# Patient Record
Sex: Female | Born: 1955 | Race: White | Hispanic: No | Marital: Married | Smoking: Never smoker
Health system: Southern US, Community
[De-identification: ages and names within clinical notes are randomized; demographics above are authoritative.]

## PROBLEM LIST (undated history)

## (undated) DIAGNOSIS — F32A Depression, unspecified: Secondary | ICD-10-CM

## (undated) DIAGNOSIS — G43909 Migraine, unspecified, not intractable, without status migrainosus: Secondary | ICD-10-CM

## (undated) DIAGNOSIS — K5900 Constipation, unspecified: Secondary | ICD-10-CM

## (undated) DIAGNOSIS — M2550 Pain in unspecified joint: Secondary | ICD-10-CM

## (undated) DIAGNOSIS — N393 Stress incontinence (female) (male): Secondary | ICD-10-CM

## (undated) DIAGNOSIS — Z973 Presence of spectacles and contact lenses: Secondary | ICD-10-CM

## (undated) DIAGNOSIS — M1990 Unspecified osteoarthritis, unspecified site: Secondary | ICD-10-CM

## (undated) DIAGNOSIS — H04123 Dry eye syndrome of bilateral lacrimal glands: Secondary | ICD-10-CM

## (undated) DIAGNOSIS — G4730 Sleep apnea, unspecified: Secondary | ICD-10-CM

## (undated) DIAGNOSIS — R87619 Unspecified abnormal cytological findings in specimens from cervix uteri: Secondary | ICD-10-CM

## (undated) DIAGNOSIS — Z8669 Personal history of other diseases of the nervous system and sense organs: Secondary | ICD-10-CM

## (undated) DIAGNOSIS — A6000 Herpesviral infection of urogenital system, unspecified: Secondary | ICD-10-CM

## (undated) DIAGNOSIS — F329 Major depressive disorder, single episode, unspecified: Secondary | ICD-10-CM

## (undated) DIAGNOSIS — IMO0002 Reserved for concepts with insufficient information to code with codable children: Secondary | ICD-10-CM

## (undated) DIAGNOSIS — B009 Herpesviral infection, unspecified: Secondary | ICD-10-CM

## (undated) HISTORY — PX: GYNECOLOGIC CRYOSURGERY: SHX857

## (undated) HISTORY — DX: Depression, unspecified: F32.A

## (undated) HISTORY — PX: NASAL SEPTUM SURGERY: SHX37

## (undated) HISTORY — DX: Unspecified abnormal cytological findings in specimens from cervix uteri: R87.619

## (undated) HISTORY — DX: Migraine, unspecified, not intractable, without status migrainosus: G43.909

## (undated) HISTORY — DX: Herpesviral infection, unspecified: B00.9

## (undated) HISTORY — PX: OTHER SURGICAL HISTORY: SHX169

## (undated) HISTORY — DX: Reserved for concepts with insufficient information to code with codable children: IMO0002

## (undated) HISTORY — DX: Unspecified osteoarthritis, unspecified site: M19.90

## (undated) HISTORY — DX: Major depressive disorder, single episode, unspecified: F32.9

---

## 1997-07-31 ENCOUNTER — Other Ambulatory Visit: Admission: RE | Admit: 1997-07-31 | Discharge: 1997-07-31 | Payer: Self-pay | Admitting: Gynecology

## 1998-07-02 ENCOUNTER — Other Ambulatory Visit: Admission: RE | Admit: 1998-07-02 | Discharge: 1998-07-02 | Payer: Self-pay | Admitting: Gynecology

## 1998-11-05 ENCOUNTER — Other Ambulatory Visit: Admission: RE | Admit: 1998-11-05 | Discharge: 1998-11-05 | Payer: Self-pay | Admitting: Gynecology

## 1999-05-18 ENCOUNTER — Other Ambulatory Visit: Admission: RE | Admit: 1999-05-18 | Discharge: 1999-05-18 | Payer: Self-pay | Admitting: Gynecology

## 2000-01-08 ENCOUNTER — Other Ambulatory Visit: Admission: RE | Admit: 2000-01-08 | Discharge: 2000-01-08 | Payer: Self-pay | Admitting: Obstetrics and Gynecology

## 2001-02-07 ENCOUNTER — Other Ambulatory Visit: Admission: RE | Admit: 2001-02-07 | Discharge: 2001-02-07 | Payer: Self-pay | Admitting: Obstetrics and Gynecology

## 2001-04-14 ENCOUNTER — Encounter: Admission: RE | Admit: 2001-04-14 | Discharge: 2001-04-14 | Payer: Self-pay | Admitting: Family Medicine

## 2001-04-14 ENCOUNTER — Encounter: Payer: Self-pay | Admitting: Family Medicine

## 2002-02-13 ENCOUNTER — Other Ambulatory Visit: Admission: RE | Admit: 2002-02-13 | Discharge: 2002-02-13 | Payer: Self-pay | Admitting: Obstetrics and Gynecology

## 2003-02-20 ENCOUNTER — Other Ambulatory Visit: Admission: RE | Admit: 2003-02-20 | Discharge: 2003-02-20 | Payer: Self-pay | Admitting: Obstetrics and Gynecology

## 2004-03-16 ENCOUNTER — Other Ambulatory Visit: Admission: RE | Admit: 2004-03-16 | Discharge: 2004-03-16 | Payer: Self-pay | Admitting: Obstetrics and Gynecology

## 2005-03-17 ENCOUNTER — Other Ambulatory Visit: Admission: RE | Admit: 2005-03-17 | Discharge: 2005-03-17 | Payer: Self-pay | Admitting: Obstetrics & Gynecology

## 2006-05-13 ENCOUNTER — Other Ambulatory Visit: Admission: RE | Admit: 2006-05-13 | Discharge: 2006-05-13 | Payer: Self-pay | Admitting: Obstetrics & Gynecology

## 2007-06-26 ENCOUNTER — Other Ambulatory Visit: Admission: RE | Admit: 2007-06-26 | Discharge: 2007-06-26 | Payer: Self-pay | Admitting: Obstetrics & Gynecology

## 2012-09-05 ENCOUNTER — Encounter: Payer: Self-pay | Admitting: Obstetrics & Gynecology

## 2012-09-07 ENCOUNTER — Ambulatory Visit (INDEPENDENT_AMBULATORY_CARE_PROVIDER_SITE_OTHER): Payer: BC Managed Care – PPO | Admitting: Obstetrics & Gynecology

## 2012-09-07 ENCOUNTER — Encounter: Payer: Self-pay | Admitting: Obstetrics & Gynecology

## 2012-09-07 VITALS — BP 116/78 | HR 72 | Ht 60.25 in | Wt 139.0 lb

## 2012-09-07 DIAGNOSIS — Z01419 Encounter for gynecological examination (general) (routine) without abnormal findings: Secondary | ICD-10-CM

## 2012-09-07 DIAGNOSIS — M25551 Pain in right hip: Secondary | ICD-10-CM

## 2012-09-07 DIAGNOSIS — Z0000 Encounter for general adult medical examination without abnormal findings: Secondary | ICD-10-CM

## 2012-09-07 DIAGNOSIS — M25559 Pain in unspecified hip: Secondary | ICD-10-CM

## 2012-09-07 LAB — HEMOGLOBIN, FINGERSTICK: Hemoglobin, fingerstick: 12 g/dL (ref 12.0–16.0)

## 2012-09-07 MED ORDER — ESTRADIOL 0.5 MG PO TABS: 0.5000 mg | ORAL_TABLET | Freq: Every day | ORAL | Status: DC

## 2012-09-07 MED ORDER — MEDROXYPROGESTERONE ACETATE 2.5 MG PO TABS: 2.5000 mg | ORAL_TABLET | Freq: Every day | ORAL | Status: DC

## 2012-09-07 MED ORDER — DICLOFENAC SODIUM 1 % TD GEL: 4.0000 g | Freq: Four times a day (QID) | TRANSDERMAL | Status: DC

## 2012-09-07 MED ORDER — VALACYCLOVIR HCL 1 G PO TABS: 1000.0000 mg | ORAL_TABLET | Freq: Every day | ORAL | Status: DC

## 2012-09-07 NOTE — Progress Notes (Signed)
Patient ID: Joan White, female   DOB: [birth year], 57 y.o.   MRN: [id number]  57 y.o. G2P2 UnknownCaucasianF here for annual exam.  No vaginal bleeding.  Doing well.  Has a new 55 month old grandson.  Patient is going to be keeping him and another child on Mon/Wed/Fri.  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    Sees Dr. Waynard Edwards but hasn't been there for several years.  Reports a new issue with pain that is most noticeable when she sits or stands.  Taking a lot of anti-inflammatories and having some GI issues with this.  Hasn't seen anyone for this.  Aware she needs to see specialist.    No LMP recorded. Patient is postmenopausal.          Sexually active: yes  The current method of family planning is tubal ligation.    Exercising: yes  walking Smoker:  no  Health Maintenance: Pap:  08/05/11, WNL, neg HR HPV History of abnormal Pap:  yes MMG:  07/11/12, Bi-Rad 1 negative Colonoscopy:  04/2005, Dr. Loreta Ave, normal, f/u 10 years BMD:   10/04/07, normal, 0.2/-0.6 TDaP:  05/13/06 Screening Labs: today, Hb today: 12.0, Urine today: Neg, pH 7.0   reports that she has never smoked. She has never used smokeless tobacco. She reports that she does not drink alcohol or use illicit drugs.  Past Medical History  Diagnosis Date  . Migraine   . HSV infection   . Arthritis   . Abnormal Pap smear     h/o cryo  . Depression     Past Surgical History  Procedure Laterality Date  . Btsp    . Cesarean section    . Nasal septum surgery      Current Outpatient Prescriptions  Medication Sig Dispense Refill  . calcium citrate-vitamin D (CALCIUM CITRATE + D) 315-200 MG-UNIT per tablet Take 1 tablet by mouth 2 (two) times daily.      . DULoxetine (CYMBALTA) 60 MG capsule Take 60 mg by mouth daily.      Marland Kitchen estradiol (MINIVELLE) 0.05 MG/24HR Place 1 patch onto the skin 2 (two) times a week.      . Multiple Vitamins-Minerals (MULTIVITAMIN PO) Take by mouth daily.      . valACYclovir (VALTREX)  1000 MG tablet Take 1,000 mg by mouth. Take as directed.      . progesterone (PROMETRIUM) 100 MG capsule Take 100 mg by mouth. Take one tablet days 1-20 monthly       No current facility-administered medications for this visit.    Family History  Problem Relation Age of Onset  . Colon cancer Paternal Grandmother   . Ulcerative colitis Father   . Heart disease Mother   . Hypertension Mother     ROS:  Pertinent items are noted in HPI.  Otherwise, a comprehensive ROS was negative.  Exam:   BP 116/78  Pulse 72  Ht 5' 0.25" (1.53 m)  Wt 139 lb (63.05 kg)  BMI 26.93 kg/m2  Weight change:  +10lbs  Height: 5' 0.25" (153 cm)  Ht Readings from Last 3 Encounters:  09/07/12 5' 0.25" (1.53 m)    General appearance: alert, cooperative and appears stated age Head: Normocephalic, without obvious abnormality, atraumatic Neck: no adenopathy, supple, symmetrical, trachea midline and thyroid normal to inspection and palpation Lungs: clear to auscultation bilaterally Breasts: normal appearance, no masses or tenderness Heart: regular rate and rhythm Abdomen: soft, non-tender; bowel sounds normal; no masses,  no organomegaly Extremities: extremities normal, atraumatic, no cyanosis or edema Skin: Skin color, texture, turgor normal. No rashes or lesions Lymph nodes: Cervical, supraclavicular, and axillary nodes normal. No abnormal inguinal nodes palpated Neurologic: Grossly normal   Pelvic: External genitalia:  no lesions              Urethra:  normal appearing urethra with no masses, tenderness or lesions              Bartholins and Skenes: normal                 Vagina: normal appearing vagina with normal color and discharge, no lesions              Cervix: no lesions              Pap taken: no Bimanual Exam:  Uterus:  normal size, contour, position, consistency, mobility, non-tender              Adnexa: normal adnexa and no mass, fullness, tenderness               Rectovaginal: Confirms                Anus:  normal sphincter tone, no lesions  A:  Well Woman with normal exam Arthritis PMP, on HRT Atypical presentation of HSV  P:   Mammogram yearly. pap smear with neg HR HPV 6/13.  No Pap today. Switch to oral estradiol and provera (0.5mg  and 2.5mg  daily).  Patient may cut estradiol in half. RF for Cymbalta given RF for Valtrex given Trial of Voltaren gel given.   Patient will call Dr. Bedelia Person for appointment return annually or prn  An After Visit Summary was printed and given to the patient.

## 2012-09-07 NOTE — Patient Instructions (Addendum)

## 2012-09-08 ENCOUNTER — Telehealth: Payer: Self-pay | Admitting: Obstetrics & Gynecology

## 2012-09-08 NOTE — Telephone Encounter (Signed)
Patient calling to see which medicine to cut in half: estradial 0.5 mg. and medroxy-progesterone 2.5 mg.?

## 2012-09-11 NOTE — Telephone Encounter (Signed)
Left message on work CB# of need to return call concerning question of medication.

## 2012-09-11 NOTE — Telephone Encounter (Signed)
LMTCB  aa   (Need to tell her she can cut the estradiol in half per last visit note with SM.)

## 2012-09-11 NOTE — Telephone Encounter (Signed)
Patient notified of instructions by Dr. Hyacinth Meeker of need to cut the estradiol in half as noted in Dr. Hyacinth Meeker note at last visit.  Patient understands directions now.

## 2012-09-26 ENCOUNTER — Telehealth: Payer: Self-pay | Admitting: *Deleted

## 2012-09-26 NOTE — Telephone Encounter (Signed)
Spoke with patient concerning Prior Authorization for Rx of Voltaren Gel. States she saw her rheumatologist yesterday and he reccommended this also.. [place]. [place] and sent to Dr. Hyacinth Meeker to fill out. Patient stated she did want to try this. She can not take sulfa medication, Celebrex or Meloxicam.

## 2012-09-29 ENCOUNTER — Telehealth: Payer: Self-pay | Admitting: *Deleted

## 2012-09-29 NOTE — Telephone Encounter (Signed)
Prior authorization sent to fax # [id number].

## 2012-09-29 NOTE — Telephone Encounter (Signed)
Prior auth signed.

## 2012-10-02 NOTE — Telephone Encounter (Signed)
Prior Joan White was faxed on 09-29-12.

## 2012-10-02 NOTE — Telephone Encounter (Signed)
Patient notified prior Joan White was sent on 09-29-12.

## 2013-01-30 ENCOUNTER — Other Ambulatory Visit: Payer: Self-pay | Admitting: Obstetrics & Gynecology

## 2013-01-30 NOTE — Telephone Encounter (Signed)
Generic cymbalta , [facility] [phone]

## 2013-01-30 NOTE — Telephone Encounter (Signed)
AEX: 09/07/12 no rx was sent/given Last refilled: 04/06/12 #30/0 refills (paper chart)  Please advise  (Chart In Your Door)

## 2013-01-30 NOTE — Telephone Encounter (Signed)
Can you please call pt and see if she is taking this as RX has not been filled per pharmacy in such a long time?  Thanks.

## 2013-01-31 MED ORDER — DULOXETINE HCL 60 MG PO CPEP: 60.0000 mg | ORAL_CAPSULE | Freq: Every day | ORAL | Status: DC

## 2013-01-31 NOTE — Telephone Encounter (Signed)
S/W patient she is still taking Cymbalta she thought that Dr. Hyacinth Meeker had gave her a rx and that she gave it to her pharmacist. She had a 90 day prescription that has run out. Needs refill, told her that I would send it to Dr.Miller to approve patient is aware.

## 2013-01-31 NOTE — Telephone Encounter (Signed)
Rx done to [facility]

## 2013-02-06 NOTE — Telephone Encounter (Signed)
Patient notified picked rx yesterday.

## 2013-10-22 ENCOUNTER — Other Ambulatory Visit: Payer: Self-pay

## 2013-10-22 NOTE — Telephone Encounter (Addendum)
Last AEX: 09/07/12 Last refill: Valtrex-09/07/12 #90 X 4, Cymbalta  01/30/13 #90 X 2 Current AEX:11/16/13  Please advise

## 2013-10-24 MED ORDER — DULOXETINE HCL 60 MG PO CPEP
60.0000 mg | ORAL_CAPSULE | Freq: Every day | ORAL | Status: DC
Start: 2013-10-24 — End: 2013-11-16

## 2013-10-24 MED ORDER — VALACYCLOVIR HCL 1 G PO TABS: 1000.0000 mg | ORAL_TABLET | Freq: Every day | ORAL | Status: DC

## 2013-11-16 ENCOUNTER — Encounter: Payer: Self-pay | Admitting: Obstetrics & Gynecology

## 2013-11-16 ENCOUNTER — Ambulatory Visit (INDEPENDENT_AMBULATORY_CARE_PROVIDER_SITE_OTHER): Payer: BC Managed Care – PPO | Admitting: Obstetrics & Gynecology

## 2013-11-16 VITALS — BP 116/66 | HR 88 | Resp 16 | Ht 60.0 in | Wt 130.0 lb

## 2013-11-16 DIAGNOSIS — Z01419 Encounter for gynecological examination (general) (routine) without abnormal findings: Secondary | ICD-10-CM

## 2013-11-16 DIAGNOSIS — Z0000 Encounter for general adult medical examination without abnormal findings: Secondary | ICD-10-CM

## 2013-11-16 DIAGNOSIS — Z124 Encounter for screening for malignant neoplasm of cervix: Secondary | ICD-10-CM

## 2013-11-16 LAB — POCT URINALYSIS DIPSTICK
Bilirubin, UA: NEGATIVE
Blood, UA: NEGATIVE
Glucose, UA: NEGATIVE
Ketones, UA: NEGATIVE
NITRITE UA: NEGATIVE
PH UA: 5
Protein, UA: NEGATIVE
UROBILINOGEN UA: NEGATIVE

## 2013-11-16 LAB — HEMOGLOBIN, FINGERSTICK: HEMOGLOBIN, FINGERSTICK: 11.8 g/dL — AB (ref 12.0–16.0)

## 2013-11-16 MED ORDER — VALACYCLOVIR HCL 1 G PO TABS: 1000.0000 mg | ORAL_TABLET | Freq: Every day | ORAL | Status: DC

## 2013-11-16 MED ORDER — DULOXETINE HCL 60 MG PO CPEP: 60.0000 mg | ORAL_CAPSULE | Freq: Every day | ORAL | Status: DC

## 2013-11-16 MED ORDER — ALPRAZOLAM 0.25 MG PO TABS: 0.2500 mg | ORAL_TABLET | Freq: Every evening | ORAL | Status: DC | PRN

## 2013-11-16 MED ORDER — ESTRADIOL 0.5 MG PO TABS: 0.5000 mg | ORAL_TABLET | Freq: Every day | ORAL | Status: DC

## 2013-11-16 MED ORDER — MEDROXYPROGESTERONE ACETATE 2.5 MG PO TABS: 2.5000 mg | ORAL_TABLET | Freq: Every day | ORAL | Status: DC

## 2013-11-16 NOTE — Progress Notes (Addendum)
58 y.o. G2P2 UnknownCaucasianF here for annual exam.  No vaginal bleeding.  Doing well.  Helping to take care of grandchildren.  Keeping busy.    Having some left axillary pain on left.  Has been going on 5-6 months.  Is carrying grandchild on the left side a lot.   PCP:  Dr. Waynard EdwardsPerini.  Hasn't seen in several years.   Husband is an alcoholic by hx and has started drinking again.  This is causing some increase stressors.  Would like a small prescription for Xanax.  No LMP recorded. Patient is postmenopausal.          Sexually active: Yes.    The current method of family planning is post menopausal status.    Exercising: No.  The patient does not participate in regular exercise at present. Smoker:  No   Health Maintenance: Pap: 07/2011 Neg. HR HPV: neg History of abnormal Pap:  yes MMG: 11/13/13 normal per pt, [facility] Colonoscopy: 04/2005 Normal. Repeat in 10 years  BMD:  09/2007 Normal TDaP:  2008 Screening Labs: today, Hb today: today, Urine today: WBC=small   reports that she has never smoked. She has never used smokeless tobacco. She reports that she does not drink alcohol or use illicit drugs.  Past Medical History  Diagnosis Date  . Migraine   . HSV infection   . Arthritis   . Abnormal Pap smear     h/o cryo  . Depression     Past Surgical History  Procedure Laterality Date  . Btsp    . Cesarean section    . Nasal septum surgery      Current Outpatient Prescriptions  Medication Sig Dispense Refill  . diclofenac sodium (VOLTAREN) 1 % GEL Apply 4 g topically 4 (four) times daily.  1 Tube  2  . DULoxetine (CYMBALTA) 60 MG capsule Take 1 capsule (60 mg total) by mouth daily.  90 capsule  0  . estradiol (ESTRACE) 0.5 MG tablet Take 1 tablet (0.5 mg total) by mouth daily.  90 tablet  4  . MAGNESIUM PO Take 750 mg by mouth daily.      . medroxyPROGESTERone (PROVERA) 2.5 MG tablet Take 1 tablet (2.5 mg total) by mouth daily.  90 tablet  4  . Melatonin 5 MG TABS Take by mouth  daily.      . Multiple Vitamins-Minerals (MULTIVITAMIN PO) Take by mouth daily.      . valACYclovir (VALTREX) 1000 MG tablet Take 1 tablet (1,000 mg total) by mouth daily. Take as directed.  90 tablet  0   No current facility-administered medications for this visit.    Family History  Problem Relation Age of Onset  . Colon cancer Paternal Grandmother   . Ulcerative colitis Father   . Heart disease Mother   . Hypertension Mother     ROS:  Pertinent items are noted in HPI.  Otherwise, a comprehensive ROS was negative.  Exam:   BP 116/66  Pulse 88  Resp 16  Ht 5' (1.524 m)  Wt 130 lb (58.968 kg)  BMI 25.39 kg/m2  Weight change: -9#  Height: 5' (152.4 cm)  Ht Readings from Last 3 Encounters:  11/16/13 5' (1.524 m)  09/07/12 5' 0.25" (1.53 m)    General appearance: alert, cooperative and appears stated age Head: Normocephalic, without obvious abnormality, atraumatic Neck: no adenopathy, supple, symmetrical, trachea midline and thyroid normal to inspection and palpation Lungs: clear to auscultation bilaterally Breasts: normal appearance, no masses or tenderness Heart:  regular rate and rhythm Abdomen: soft, non-tender; bowel sounds normal; no masses,  no organomegaly Extremities: extremities normal, atraumatic, no cyanosis or edema Skin: Skin color, texture, turgor normal. No rashes or lesions Lymph nodes: Cervical, supraclavicular, and axillary nodes normal. No abnormal inguinal nodes palpated Neurologic: Grossly normal   Pelvic: External genitalia:  no lesions              Urethra:  normal appearing urethra with no masses, tenderness or lesions              Bartholins and Skenes: normal                 Vagina: normal appearing vagina with normal color and discharge, no lesions              Cervix: no lesions              Pap taken: Yes.   Bimanual Exam:  Uterus:  normal size, contour, position, consistency, mobility, non-tender              Adnexa: normal adnexa and no  mass, fullness, tenderness               Rectovaginal: Confirms               Anus:  normal sphincter tone, no lesions  A:  Well Woman with normal exam  Arthritis  PMP, on HRT  Atypical presentation of HSV Axilla pain--no findings on ultrasound exam Anxiety, situational   P: Mammogram yearly.  pap smear with neg HR HPV 6/13.  Pap today. RX for Estradiol 0.5mg  daily (pt cutting in half) and Provera 2.5mg  daily RF for Cymbalta 60mg  daily given  RF for Valtrex 1gm given  Xanax 0.25mg  prn #30/0RF CMP, TSH, Lipids, Vit D return annually or prn  An After Visit Summary was printed and given to the patient.

## 2013-11-17 LAB — LIPID PANEL
Cholesterol: 132 mg/dL (ref 0–200)
HDL: 69 mg/dL (ref >39)
LDL CALC: 41 mg/dL (ref 0–99)
Total CHOL/HDL Ratio: 1.9 Ratio
Triglycerides: 109 mg/dL (ref <150)
VLDL: 22 mg/dL (ref 0–40)

## 2013-11-17 LAB — COMPREHENSIVE METABOLIC PANEL
ALT: 16 U/L (ref 0–35)
AST: 21 U/L (ref 0–37)
Albumin: 4.5 g/dL (ref 3.5–5.2)
Alkaline Phosphatase: 75 U/L (ref 39–117)
BUN: 14 mg/dL (ref 6–23)
CALCIUM: 9.8 mg/dL (ref 8.4–10.5)
CO2: 28 mEq/L (ref 19–32)
Chloride: 97 mEq/L (ref 96–112)
Creat: 0.7 mg/dL (ref 0.50–1.10)
Glucose, Bld: 86 mg/dL (ref 70–99)
Potassium: 3.8 mEq/L (ref 3.5–5.3)
SODIUM: 135 meq/L (ref 135–145)
TOTAL PROTEIN: 7.4 g/dL (ref 6.0–8.3)
Total Bilirubin: 0.3 mg/dL (ref 0.2–1.2)

## 2013-11-17 LAB — TSH: TSH: 1.379 u[IU]/mL (ref 0.350–4.500)

## 2013-11-17 LAB — VITAMIN D 25 HYDROXY (VIT D DEFICIENCY, FRACTURES): Vit D, 25-Hydroxy: 46 ng/mL (ref 30–89)

## 2013-11-19 ENCOUNTER — Telehealth: Payer: Self-pay

## 2013-11-19 LAB — IPS PAP TEST WITH REFLEX TO HPV

## 2013-11-19 NOTE — Telephone Encounter (Signed)
Message copied by Elisha HeadlandNIX, Hayla Hinger S on Mon Nov 19, 2013 11:35 AM ------      Message from: Jerene BearsMILLER, MARY S      Created: Mon Nov 19, 2013  6:15 AM       Inform lipids, CMP, TSH, and Vit D were all normal. ------

## 2013-11-19 NOTE — Telephone Encounter (Signed)
Lmtcb//kn 

## 2013-11-19 NOTE — Telephone Encounter (Signed)
Pt called kelly back during lunch °

## 2013-11-20 NOTE — Telephone Encounter (Signed)
Patient notified of all results.//kn 

## 2013-11-20 NOTE — Telephone Encounter (Signed)
Pt returning call

## 2013-12-10 ENCOUNTER — Encounter: Payer: Self-pay | Admitting: Obstetrics & Gynecology

## 2014-08-01 ENCOUNTER — Other Ambulatory Visit: Payer: Self-pay | Admitting: *Deleted

## 2014-08-01 MED ORDER — TRAMADOL HCL 50 MG PO TABS: 50.0000 mg | ORAL_TABLET | Freq: Four times a day (QID) | ORAL | Status: DC | PRN

## 2014-08-01 NOTE — Telephone Encounter (Addendum)
Medication refill request: Tramadol 50 mg Last AEX:  11/16/13 with SM Next AEX: 12/27/14 with SM Last MMG (if hormonal medication request): n/a Refill authorized: Please advise.  Patient called and said that she was pulling some things and pulled her shoulder really bad. She wanted Dr. Hyacinth Meeker to know that she is going to see Dr.Perini in August as a new patient appointment and was wondering if Dr. Hyacinth Meeker could refill this for her, please advise.

## 2014-08-02 ENCOUNTER — Telehealth: Payer: Self-pay | Admitting: Obstetrics & Gynecology

## 2014-08-02 NOTE — Telephone Encounter (Signed)
Patient is asking for status of refill. Patient says she spoke with a nurse yesterday. Last seen 11/16/13.

## 2014-08-02 NOTE — Telephone Encounter (Signed)
Patient notified that rx is being faxed in by reina,cma

## 2014-08-02 NOTE — Telephone Encounter (Signed)
Pt aware rx has been faxed by reina,cma

## 2014-12-27 ENCOUNTER — Encounter: Payer: Self-pay | Admitting: Obstetrics & Gynecology

## 2014-12-27 ENCOUNTER — Ambulatory Visit (INDEPENDENT_AMBULATORY_CARE_PROVIDER_SITE_OTHER): Payer: BC Managed Care – PPO | Admitting: Obstetrics & Gynecology

## 2014-12-27 VITALS — BP 118/82 | HR 80 | Resp 16 | Ht 60.25 in | Wt 138.0 lb

## 2014-12-27 DIAGNOSIS — Z01419 Encounter for gynecological examination (general) (routine) without abnormal findings: Secondary | ICD-10-CM

## 2014-12-27 DIAGNOSIS — Z79890 Hormone replacement therapy (postmenopausal): Secondary | ICD-10-CM | POA: Diagnosis not present

## 2014-12-27 MED ORDER — DULOXETINE HCL 60 MG PO CPEP: 60.0000 mg | ORAL_CAPSULE | Freq: Every day | ORAL | Status: DC

## 2014-12-27 MED ORDER — CYCLOBENZAPRINE HCL 10 MG PO TABS
10.0000 mg | ORAL_TABLET | Freq: Three times a day (TID) | ORAL | Status: DC | PRN
Start: 2014-12-27 — End: 2015-06-06

## 2014-12-27 MED ORDER — VALACYCLOVIR HCL 1 G PO TABS: 1000.0000 mg | ORAL_TABLET | Freq: Every day | ORAL | Status: DC

## 2014-12-27 MED ORDER — ESTRADIOL 0.5 MG PO TABS: 0.5000 mg | ORAL_TABLET | Freq: Every day | ORAL | Status: DC

## 2014-12-27 MED ORDER — MEDROXYPROGESTERONE ACETATE 2.5 MG PO TABS: 2.5000 mg | ORAL_TABLET | Freq: Every day | ORAL | Status: DC

## 2014-12-27 NOTE — Progress Notes (Signed)
59 y.o. G2P2 Married CaucasianF here for annual exam.  Doing well.  No vaginal bleeding.  Pt reports she's having a lot of neck and back tightness.  She does have some stressors.  Has been to a chiropractor for this as well.  Using heat on her back.  Did do a massage today and she's really felt like this happened.  X ray ordered an x ray and this wasn't diagnostic.  MRI has been recommended.  Pt doesn't want to do this currently due to cost.  PCP:  Dr. Waynard Edwards.  Planning blood work next year.    No LMP recorded. Patient is postmenopausal.          Sexually active: Yes.    The current method of family planning is tubal ligation.    Exercising: No.  not regularly Smoker:  no  Health Maintenance: Pap:  11/16/13 WNL, 6/13 WNL/negative HR HPV History of abnormal Pap:  Yes h/o cryosurgery MMG:  11/13/13 MMG, 11/15/13 right diag-BiRads 2 Benign Colonoscopy:  3/07-repeat in 10 years.  Pt aware this is due next year.   BMD:   8/09-normal TDaP:  1/16 Screening Labs: Dr Waynard Edwards, Hb today: Dr Waynard Edwards, Urine today: Dr Waynard Edwards   reports that she has never smoked. She has never used smokeless tobacco. She reports that she does not drink alcohol or use illicit drugs.  Past Medical History  Diagnosis Date  . Migraine   . HSV infection   . Arthritis   . Abnormal Pap smear     h/o cryo  . Depression     Past Surgical History  Procedure Laterality Date  . Btsp    . Cesarean section    . Nasal septum surgery      Current Outpatient Prescriptions  Medication Sig Dispense Refill  . ALPRAZolam (XANAX) 0.25 MG tablet Take 1 tablet (0.25 mg total) by mouth at bedtime as needed for anxiety. 30 tablet 0  . Cholecalciferol (VITAMIN D PO) Take 1,000 Int'l Units by mouth.    . Cyanocobalamin (VITAMIN B 12 PO) Take 1,000 mg by mouth.    . diclofenac sodium (VOLTAREN) 1 % GEL Apply 4 g topically 4 (four) times daily. 1 Tube 2  . DULoxetine (CYMBALTA) 60 MG capsule Take 1 capsule (60 mg total) by mouth daily. 90  capsule 4  . estradiol (ESTRACE) 0.5 MG tablet Take 1 tablet (0.5 mg total) by mouth daily. 90 tablet 4  . MAGNESIUM PO Take 750 mg by mouth daily.    . medroxyPROGESTERone (PROVERA) 2.5 MG tablet Take 1 tablet (2.5 mg total) by mouth daily. 90 tablet 4  . Melatonin 5 MG TABS Take by mouth daily.    . methocarbamol (ROBAXIN) 500 MG tablet     . Multiple Vitamins-Minerals (MULTIVITAMIN PO) Take by mouth daily.    . valACYclovir (VALTREX) 1000 MG tablet Take 1 tablet (1,000 mg total) by mouth daily. Take as directed. 90 tablet 4   No current facility-administered medications for this visit.    Family History  Problem Relation Age of Onset  . Colon cancer Paternal Grandmother   . Ulcerative colitis Father   . Heart disease Mother   . Hypertension Mother     ROS:  Pertinent items are noted in HPI.  Otherwise, a comprehensive ROS was negative.  Exam:   BP 118/82 mmHg  Pulse 80  Resp 16  Ht 5' 0.25" (1.53 m)  Wt 138 lb (62.596 kg)  BMI 26.74 kg/m2    Height:  5' 0.25" (153 cm)  Ht Readings from Last 3 Encounters:  12/27/14 5' 0.25" (1.53 m)  11/16/13 5' (1.524 m)  09/07/12 5' 0.25" (1.53 m)    General appearance: alert, cooperative and appears stated age Head: Normocephalic, without obvious abnormality, atraumatic Neck: no adenopathy, supple, symmetrical, trachea midline and thyroid normal to inspection and palpation Lungs: clear to auscultation bilaterally Breasts: normal appearance, no masses or tenderness Heart: regular rate and rhythm Abdomen: soft, non-tender; bowel sounds normal; no masses,  no organomegaly Extremities: extremities normal, atraumatic, no cyanosis or edema Skin: Skin color, texture, turgor normal. No rashes or lesions Lymph nodes: Cervical, supraclavicular, and axillary nodes normal. No abnormal inguinal nodes palpated Neurologic: Grossly normal  Pelvic: External genitalia:  no lesions              Urethra:  normal appearing urethra with no masses,  tenderness or lesions              Bartholins and Skenes: normal                 Vagina: normal appearing vagina with normal color and discharge, no lesions              Cervix: no lesions              Pap taken: No. Bimanual Exam:  Uterus:  normal size, contour, position, consistency, mobility, non-tender              Adnexa: normal adnexa and no mass, fullness, tenderness               Rectovaginal: Confirms               Anus:  normal sphincter tone, no lesions  Chaperone was present for exam.  A:  Well Woman with normal exam  Arthritis  PMP, on HRT  Atypical presentation of HSV Muscle spasm Anxiety, situational  P: Mammogram yearly. Pt will check about insurance coverage for BMD and call us if she wants us to schedule this for her. pap smear with neg HR HPV 6/13. Pap 2015.  No pap today. RX for Estradiol 0.5mg  daily (pt cutting in half) and Provera 2.5mg  daily RF for Cymbalta 60mg  daily.  #90/4RF RF for Valtrex 1gm.  #90/4RF Flexeril 10mg  tid prn given to see if this works better than the Robaxin that we have on file. Labs next year with Dr. Waynard EdwardsPerini.   return annually or prn

## 2014-12-27 NOTE — Patient Instructions (Addendum)
[facility] [address] [place], [place] [address]  [phone]   Joan RiddleNing White-- acupuncture

## 2015-01-09 ENCOUNTER — Telehealth: Payer: Self-pay | Admitting: Obstetrics & Gynecology

## 2015-01-09 NOTE — Telephone Encounter (Signed)
Patient called and requested an order be faxed to [facility] for [facility] density. She is having her mammogram 01/14/15 and wants to have the bone density on the same day.

## 2015-01-09 NOTE — Telephone Encounter (Signed)
Order signed.  Will return to you to fax.

## 2015-01-09 NOTE — Telephone Encounter (Signed)
BMD order to Dr.Miller for review and signature prior to faxing.

## 2015-01-10 NOTE — Telephone Encounter (Signed)
Order for BMD faxed to [facility] with cover sheet and confirmation. Patient notified.  Will close encounter.

## 2015-01-17 ENCOUNTER — Telehealth: Payer: Self-pay | Admitting: *Deleted

## 2015-01-17 NOTE — Telephone Encounter (Signed)
"   BMD showed mild osteopenia in R Femur. Ok to continue to watch. Recheck 3 - 5 years" - Dr Hyacinth MeekerMiller.  Called patient. Notified. Verbalized understanding.   BMD to scan.  Encounter closed.

## 2015-01-20 ENCOUNTER — Other Ambulatory Visit: Payer: Self-pay | Admitting: Internal Medicine

## 2015-01-20 DIAGNOSIS — M542 Cervicalgia: Secondary | ICD-10-CM

## 2015-01-23 ENCOUNTER — Other Ambulatory Visit: Payer: Self-pay

## 2015-06-06 ENCOUNTER — Other Ambulatory Visit: Payer: Self-pay | Admitting: Obstetrics & Gynecology

## 2015-06-06 NOTE — Telephone Encounter (Signed)
Medication refill request: Flexeril 10 mg  Last AEX:  12/27/2014 with SM  Next AEX: 04/23/2016 with SM  Last MMG (if hormonal medication request): n/a Refill authorized: Please advise.  Routed to Dr. Edward JollySilva since Dr. Hyacinth MeekerMiller is out of office today.

## 2015-11-10 ENCOUNTER — Telehealth: Payer: Self-pay | Admitting: Obstetrics & Gynecology

## 2015-11-10 NOTE — Telephone Encounter (Signed)
Patient wanting nurse to check her chart to find out when she had her last colonoscopy.

## 2015-11-10 NOTE — Telephone Encounter (Signed)
Spoke with patient. Patient calling to see if we have record of last colonoscopy. Advised patient per AEX dated 09/07/12; colonoscopy with Dr. Loreta AveMann 04/2005. Provided contact information for Dr. Loreta AveMann; [phone]. Patient to call and schedule 10 year colonoscopy. Patient states she does not remember seeing Dr. Hyacinth MeekerMiller for AEX this year, patient last seen for AEX 12/27/14. Patient was on schedule for 04/23/16 for AEX; per patient request, rescheduled to 12/30/15 at 1:30 pm for AEX with Dr. Hyacinth MeekerMiller. Patient is agreeable to date and time.   Routing to provider for final review. Patient is agreeable to disposition. Will close encounter.

## 2015-11-10 NOTE — Telephone Encounter (Signed)
I have attempted to contact this patient by phone with the following results: left message to return my call on answering machine (mobile). [phone] (Mobile)

## 2015-11-18 DIAGNOSIS — M25562 Pain in left knee: Secondary | ICD-10-CM

## 2015-11-18 DIAGNOSIS — G8929 Other chronic pain: Secondary | ICD-10-CM | POA: Insufficient documentation

## 2015-11-18 DIAGNOSIS — M25561 Pain in right knee: Secondary | ICD-10-CM

## 2015-12-30 ENCOUNTER — Ambulatory Visit (INDEPENDENT_AMBULATORY_CARE_PROVIDER_SITE_OTHER): Payer: BC Managed Care – PPO | Admitting: Obstetrics & Gynecology

## 2015-12-30 ENCOUNTER — Encounter: Payer: Self-pay | Admitting: Obstetrics & Gynecology

## 2015-12-30 VITALS — BP 120/70 | HR 90 | Resp 14 | Ht 61.0 in | Wt 139.2 lb

## 2015-12-30 DIAGNOSIS — Z124 Encounter for screening for malignant neoplasm of cervix: Secondary | ICD-10-CM | POA: Diagnosis not present

## 2015-12-30 DIAGNOSIS — Z205 Contact with and (suspected) exposure to viral hepatitis: Secondary | ICD-10-CM | POA: Diagnosis not present

## 2015-12-30 DIAGNOSIS — Z01419 Encounter for gynecological examination (general) (routine) without abnormal findings: Secondary | ICD-10-CM | POA: Diagnosis not present

## 2015-12-30 MED ORDER — ESTRADIOL 0.5 MG PO TABS: 0.5000 mg | ORAL_TABLET | Freq: Every day | ORAL | 4 refills | Status: DC

## 2015-12-30 MED ORDER — MEDROXYPROGESTERONE ACETATE 2.5 MG PO TABS: 2.5000 mg | ORAL_TABLET | Freq: Every day | ORAL | 4 refills | Status: DC

## 2015-12-30 NOTE — Progress Notes (Signed)
60 y.o. G2P2 Unknown Caucasian F here for annual exam.  Pt's reports lots of events in her family.  She had a new granddaughter born in July.  [place] in [place].  She has separate, again, as husband is an alcoholic and drinking again.  She is in her parent's home as they are both in assisted living.  She is keeping her two grandchildren two or three days a week.    Denies vaginal bleeding.   Reports she's having left shoulder pain.  Dr. Waynard EdwardsPerini wanted her to have an MRI.  Co-pay was $700.  She doesn't feel like she can do this, financially at this time.   PCP:  Dr. Waynard EdwardsPerini.    No LMP recorded. Patient is postmenopausal.          Sexually active: No.  The current method of family planning is post menopausal status.    Exercising: No.  The patient does not participate in regular exercise at present. Smoker:  no  Health Maintenance: Pap:  11/16/13 negative  History of abnormal Pap:  yes MMG:  01/14/15 BIRADS 2 benign  Colonoscopy:  12/24/15 with Dr. Loreta AveMann, follow up 10 years.  BMD:   01/14/15 osteopenia in right femur  TDaP:  1/16  Pneumonia vaccine(s):  11/18/15  Zostavax:   04/10/14  Hep C testing: will do today Screening Labs: PCP, Hb today: PCP, Urine today: PCP   reports that she has never smoked. She has never used smokeless tobacco. She reports that she does not drink alcohol or use drugs.  Past Medical History:  Diagnosis Date  . Abnormal Pap smear    h/o cryo  . Arthritis   . Depression   . HSV infection   . Migraine     Past Surgical History:  Procedure Laterality Date  . BTSP    . CESAREAN SECTION    . GYNECOLOGIC CRYOSURGERY    . NASAL SEPTUM SURGERY      Current Outpatient Prescriptions  Medication Sig Dispense Refill  . ALPRAZolam (XANAX) 0.25 MG tablet Take 1 tablet (0.25 mg total) by mouth at bedtime as needed for anxiety. 30 tablet 0  . Cholecalciferol (VITAMIN D PO) Take 1,000 Int'l Units by mouth.    . Cyanocobalamin (VITAMIN B 12 PO) Take 1,000 mg  by mouth.    . cyclobenzaprine (FLEXERIL) 10 MG tablet TAKE 1 TABLET BY MOUTH 3 TIMES DAILY AS NEEDED FOR MUSCLE SPASMS. 30 tablet 0  . diclofenac sodium (VOLTAREN) 1 % GEL Apply 4 g topically 4 (four) times daily. 1 Tube 2  . DULoxetine (CYMBALTA) 60 MG capsule Take 1 capsule (60 mg total) by mouth daily. 90 capsule 4  . estradiol (ESTRACE) 0.5 MG tablet Take 1 tablet (0.5 mg total) by mouth daily. 90 tablet 4  . MAGNESIUM PO Take 750 mg by mouth daily.    . medroxyPROGESTERone (PROVERA) 2.5 MG tablet Take 1 tablet (2.5 mg total) by mouth daily. 90 tablet 4  . Melatonin 5 MG TABS Take by mouth daily.    . methocarbamol (ROBAXIN) 500 MG tablet     . Multiple Vitamins-Minerals (MULTIVITAMIN PO) Take by mouth daily.    . valACYclovir (VALTREX) 1000 MG tablet Take 1 tablet (1,000 mg total) by mouth daily. Take as directed. 90 tablet 4   No current facility-administered medications for this visit.     Family History  Problem Relation Age of Onset  . Colon cancer Paternal Grandmother   . Ulcerative colitis Father   .  Heart disease Mother   . Hypertension Mother     ROS:  Pertinent items are noted in HPI.  Otherwise, a comprehensive ROS was negative.  Exam:   BP 120/70 (BP Location: Right Arm, Patient Position: Sitting, Cuff Size: Normal)   Pulse 90   Resp 14   Ht 5\' 1"  (1.549 m)   Wt 139 lb 3.2 oz (63.1 kg)   BMI 26.30 kg/m   Weight change: +1#  Height: 5\' 1"  (154.9 cm)  Ht Readings from Last 3 Encounters:  12/30/15 5\' 1"  (1.549 m)  12/27/14 5' 0.25" (1.53 m)  11/16/13 5' (1.524 m)    General appearance: alert, cooperative and appears stated age Head: Normocephalic, without obvious abnormality, atraumatic Neck: no adenopathy, supple, symmetrical, trachea midline and thyroid normal to inspection and palpation Lungs: clear to auscultation bilaterally Breasts: normal appearance, no masses or tenderness Heart: regular rate and rhythm Abdomen: soft, non-tender; bowel sounds normal;  no masses,  no organomegaly Extremities: extremities normal, atraumatic, no cyanosis or edema Skin: Skin color, texture, turgor normal. No rashes or lesions Lymph nodes: Cervical, supraclavicular, and axillary nodes normal. No abnormal inguinal nodes palpated Neurologic: Grossly normal   Pelvic: External genitalia:  no lesions              Urethra:  normal appearing urethra with no masses, tenderness or lesions              Bartholins and Skenes: normal                 Vagina: normal appearing vagina with normal color and discharge, no lesions              Cervix: no lesions              Pap taken: Yes.   Bimanual Exam:  Uterus:  normal size, contour, position, consistency, mobility, non-tender              Adnexa: normal adnexa and no mass, fullness, tenderness               Rectovaginal: Confirms               Anus:  normal sphincter tone, no lesions  Chaperone was present for exam.  A:   Well Woman with normal exam  Arthritis in shoulder and knee  PMP, on HRT  H/O atypical HSV Anxiety, situational  P:  Mammogram yearly.  Pap smear and HR HPV obtained today. RX for Estradiol 0.5mg  daily (pt cutting in half) and Provera 2.5mg  daily She will call if needs RFs for Valtrex and/or Cymbalta Labs /vaccines done with Dr. Waynard EdwardsPerini.    Hep C antibody Return annually or prn

## 2015-12-31 ENCOUNTER — Other Ambulatory Visit: Payer: Self-pay | Admitting: Obstetrics & Gynecology

## 2015-12-31 LAB — HEPATITIS C ANTIBODY: HCV AB: NEGATIVE

## 2015-12-31 MED ORDER — DULOXETINE HCL 60 MG PO CPEP: 60.0000 mg | ORAL_CAPSULE | Freq: Every day | ORAL | 4 refills | Status: AC

## 2015-12-31 NOTE — Telephone Encounter (Signed)
Patient calling for a prescription for Duloxetine HCL 60MG  sent to [facility] at [phone].

## 2015-12-31 NOTE — Telephone Encounter (Signed)
Med refill request: Duloxetine 60mg  Last AEX: 12/30/15 Next AEX: 01/10/17 Last MMG (if hormonal med) Refill authorized: Please Advise? Last filled 12/27/14 by MM -#[id number]   Cc: Dr. Hyacinth MeekerMiller

## 2016-01-02 LAB — PAP IG AND HPV HIGH-RISK: HPV DNA High Risk: NOT DETECTED

## 2016-01-05 NOTE — Telephone Encounter (Signed)
Prescription faxed to [facility]. Spoke with patient, advised RX sent. Patient verbalizes understanding and is agreeable.  Routing to provider for final review. Patient is agreeable to disposition. Will close encounter.

## 2016-01-22 ENCOUNTER — Encounter: Payer: Self-pay | Admitting: Obstetrics & Gynecology

## 2016-04-23 ENCOUNTER — Ambulatory Visit: Payer: BC Managed Care – PPO | Admitting: Obstetrics & Gynecology

## 2016-06-01 ENCOUNTER — Other Ambulatory Visit: Payer: Self-pay | Admitting: Orthopedic Surgery

## 2016-06-01 DIAGNOSIS — R52 Pain, unspecified: Secondary | ICD-10-CM

## 2016-06-08 HISTORY — PX: SHOULDER SURGERY: SHX246

## 2016-06-14 ENCOUNTER — Ambulatory Visit
Admission: RE | Admit: 2016-06-14 | Discharge: 2016-06-14 | Disposition: A | Discharge status: Home / Self Care | Payer: BC Managed Care – PPO | Source: Ambulatory Visit | Attending: Orthopedic Surgery | Admitting: Orthopedic Surgery

## 2016-06-14 DIAGNOSIS — R52 Pain, unspecified: Secondary | ICD-10-CM

## 2016-06-28 DIAGNOSIS — Z9889 Other specified postprocedural states: Secondary | ICD-10-CM | POA: Insufficient documentation

## 2016-12-06 ENCOUNTER — Telehealth: Payer: Self-pay | Admitting: Obstetrics & Gynecology

## 2016-12-06 NOTE — Telephone Encounter (Signed)
Patient called and left a message at lunch on our voice mail requesting a call back. She did not leave any other details. I returned her call and left a message to call our office back.

## 2017-01-10 ENCOUNTER — Ambulatory Visit: Payer: BC Managed Care – PPO | Admitting: Obstetrics & Gynecology

## 2017-02-08 HISTORY — PX: CATARACT EXTRACTION, BILATERAL: SHX1313

## 2017-02-14 ENCOUNTER — Other Ambulatory Visit: Payer: Self-pay

## 2017-02-14 ENCOUNTER — Ambulatory Visit (INDEPENDENT_AMBULATORY_CARE_PROVIDER_SITE_OTHER): Payer: BC Managed Care – PPO | Admitting: Obstetrics & Gynecology

## 2017-02-14 ENCOUNTER — Encounter: Payer: Self-pay | Admitting: Obstetrics & Gynecology

## 2017-02-14 VITALS — BP 130/86 | HR 72 | Resp 12 | Ht 60.0 in | Wt 133.4 lb

## 2017-02-14 DIAGNOSIS — Z01419 Encounter for gynecological examination (general) (routine) without abnormal findings: Secondary | ICD-10-CM | POA: Diagnosis not present

## 2017-02-14 DIAGNOSIS — N898 Other specified noninflammatory disorders of vagina: Secondary | ICD-10-CM

## 2017-02-14 MED ORDER — MEDROXYPROGESTERONE ACETATE 2.5 MG PO TABS: 2.5000 mg | ORAL_TABLET | Freq: Every day | ORAL | 4 refills | Status: DC

## 2017-02-14 MED ORDER — TERCONAZOLE 0.4 % VA CREA: 1.0000 | TOPICAL_CREAM | Freq: Every day | VAGINAL | 0 refills | Status: DC

## 2017-02-14 MED ORDER — ESTRADIOL 0.5 MG PO TABS: 0.5000 mg | ORAL_TABLET | Freq: Every day | ORAL | 4 refills | Status: DC

## 2017-02-14 MED ORDER — ESTRADIOL 0.1 MG/GM VA CREA
TOPICAL_CREAM | VAGINAL | 4 refills | Status: DC
Start: 2017-02-14 — End: 2017-04-29

## 2017-02-14 NOTE — Progress Notes (Signed)
62 y.o. G2P2 Divorced Caucasian F here for annual exam.  Doing well.  Got married 01/28/17.  He had back surgery in early December.  She is going to change her last name to Casimiro NeedleMichael.  Not concerned with any STDs.  Denies vaginal bleeding.  Having some dryness.      No LMP recorded. Patient is postmenopausal.          Sexually active: Yes.    The current method of family planning is post menopausal status.    Exercising: Yes.    walking Smoker:  no  Health Maintenance: Pap:  12/30/15 neg. HR HPV:neg   11/16/13 Neg  History of abnormal Pap:  yes MMG:  [facility] Colonoscopy:  12/24/15 Normal. F/u 10 years  BMD:   01/14/15 Osteopenia, done last week with PCP TDaP:  2016 Pneumonia vaccine(s):  2017, 2018 with PCP  Shingrix:   2016 Hep C testing: 12/30/15 Neg  Screening Labs: PCP, Hb today: PCP, Urine today: has urine sample if needed    reports that  has never smoked. she has never used smokeless tobacco. She reports that she does not drink alcohol or use drugs.  Past Medical History:  Diagnosis Date  . Abnormal Pap smear    h/o cryo  . Arthritis   . Depression   . HSV infection   . Migraine     Past Surgical History:  Procedure Laterality Date  . BTSP    . CESAREAN SECTION    . GYNECOLOGIC CRYOSURGERY    . NASAL SEPTUM SURGERY    . SHOULDER SURGERY Right 06/2016   Dr. Sherlean FootLucey     Current Outpatient Medications  Medication Sig Dispense Refill  . Cholecalciferol (VITAMIN D PO) Take 1,000 Int'l Units by mouth.    . Cyanocobalamin (VITAMIN B 12 PO) Take 1,000 mg by mouth.    . cyclobenzaprine (FLEXERIL) 10 MG tablet TAKE 1 TABLET BY MOUTH 3 TIMES DAILY AS NEEDED FOR MUSCLE SPASMS. 30 tablet 0  . DULoxetine (CYMBALTA) 60 MG capsule Take 1 capsule (60 mg total) by mouth daily. 90 capsule 4  . estradiol (ESTRACE) 0.5 MG tablet Take 1 tablet (0.5 mg total) by mouth daily. 90 tablet 4  . MAGNESIUM PO Take 750 mg by mouth daily.    . medroxyPROGESTERone (PROVERA) 2.5 MG tablet  Take 1 tablet (2.5 mg total) by mouth daily. 90 tablet 4  . Multiple Vitamins-Minerals (MULTIVITAMIN PO) Take by mouth daily.    . valACYclovir (VALTREX) 1000 MG tablet Take 1 tablet (1,000 mg total) by mouth daily. Take as directed. 90 tablet 4   No current facility-administered medications for this visit.     Family History  Problem Relation Age of Onset  . Colon cancer Paternal Grandmother   . Ulcerative colitis Father   . Heart disease Mother   . Hypertension Mother     ROS:  Pertinent items are noted in HPI.  Otherwise, a comprehensive ROS was negative.  Exam:   BP 130/86 (BP Location: Right Arm, Patient Position: Sitting, Cuff Size: Normal)   Pulse 72   Resp 12   Ht 5' (1.524 m)   Wt 133 lb 6.4 oz (60.5 kg)   BMI 26.05 kg/m     Height: 5' (152.4 cm)  Ht Readings from Last 3 Encounters:  02/14/17 5' (1.524 m)  12/30/15 5\' 1"  (1.549 m)  12/27/14 5' 0.25" (1.53 m)    General appearance: alert, cooperative and appears stated age Head: Normocephalic, without obvious  abnormality, atraumatic Neck: no adenopathy, supple, symmetrical, trachea midline and thyroid normal to inspection and palpation Lungs: clear to auscultation bilaterally Breasts: normal appearance, no masses or tenderness Heart: regular rate and rhythm Abdomen: soft, non-tender; bowel sounds normal; no masses,  no organomegaly Extremities: extremities normal, atraumatic, no cyanosis or edema Skin: Skin color, texture, turgor normal. No rashes or lesions Lymph nodes: Cervical, supraclavicular, and axillary nodes normal. No abnormal inguinal nodes palpated Neurologic: Grossly normal   Pelvic: External genitalia:  no lesions              Urethra:  normal appearing urethra with no masses, tenderness or lesions              Bartholins and Skenes: normal                 Vagina: normal appearing vagina with normal color and whitish discharge present, no lesions              Cervix: no lesions              Pap  taken: No. Bimanual Exam:  Uterus:  normal size, contour, position, consistency, mobility, non-tender              Adnexa: normal adnexa and no mass, fullness, tenderness               Rectovaginal: Confirms               Anus:  normal sphincter tone, no lesions  Chaperone was present for exam.  A:  Well Woman with normal exam PMP, on low dosed HRT Arthritis H/O atypical HSV Vaginal dryness Yeast vaginitis  P:   Mammogram guidelines reviewed.  Pt aware she will be called back for follow-up MMG and ultrasound pap smear and neg HR HPV 2017.  No pap today. Blood work and vaccines UTD RF for estrace 0.5mg  1/2 tab daily and Provera 5mg  1/2 tab daily.   Affirm pending Terazol 7 nightly to pharmacy. Once she is done with the Terazol, she will start using the estrace vaginal cream 1gm pv twice weekly

## 2017-02-15 LAB — VAGINITIS/VAGINOSIS, DNA PROBE
Candida Species: POSITIVE — AB
GARDNERELLA VAGINALIS: POSITIVE — AB
Trichomonas vaginosis: NEGATIVE

## 2017-02-18 ENCOUNTER — Telehealth: Payer: Self-pay | Admitting: Obstetrics & Gynecology

## 2017-02-18 MED ORDER — METRONIDAZOLE 500 MG PO TABS: 500.0000 mg | ORAL_TABLET | Freq: Two times a day (BID) | ORAL | 0 refills | Status: DC

## 2017-02-18 NOTE — Telephone Encounter (Signed)
Dr. Hyacinth MeekerMiller  -please advise on lab results dated 02/14/17 - positive yeast and BV.

## 2017-02-18 NOTE — Telephone Encounter (Signed)
Spoke with patient, advised of affirm  results and recommendations as seen below. Patient states RX for Terazol started 02/14/17, will follow with estrace vaginal cream. Reports symptoms are improving.   Patient has 2 nights of Terazol remaining, Rx for Flagyl 500 mg PO bid x 7 days sent, alternative for Metrogel. ETOH precautions reviewed. Patient verbalizes understanding and is agreeable.   Routing to provider for final review. Patient is agreeable to disposition. Will close encounter.         Notes recorded by Jerene BearsMiller, Mary S, MD on 02/18/2017 at 3:42 PM EST Pap with yeast. If symptomatic, treat with Diflucan 150mg  po x 1, repeat 48 hrs if needed. No rx done. Please inform pt that her results showed yeast and BV. Ok to treat with Diflucan 150mg  po x 1, repeat 72 hours and Metrogel 0.75%, one applicator QHS x 5 nights. No follow up needed if symptoms resolve.  CC: Gara Kronereina Morales

## 2017-02-18 NOTE — Telephone Encounter (Signed)
Patient called to see if her recent test results are back from her last visit on 02/14/17.

## 2017-02-18 NOTE — Telephone Encounter (Signed)
Results routed to you with recommendations on them.  Thanks.

## 2017-02-21 ENCOUNTER — Telehealth: Payer: Self-pay | Admitting: Obstetrics & Gynecology

## 2017-02-21 NOTE — Telephone Encounter (Signed)
Patient just picked up her "vaginal cream" prescription and is wondering when does she need to start this medication? Patient is still taking the bacterial infection medication and is asking if she needs to complete the bacterial infection medication before starting the vaginal cream? Patient also asked when could she start having intercourse?

## 2017-02-21 NOTE — Telephone Encounter (Signed)
Spoke with patient regarding message below. She states she just completed Terazol 7 vaginal cream last night. Picked up Flagyl po Rx today and began. She wants to know when should she begin Estrace cream? Advised okay to begin in next day or two. She might want to wait until Terazol completely out of vagina first. Okay to resume intercourse 5 days after completely Flagyl as long as she is asymptomatic. No follow up needed as long as symptoms resolve.

## 2017-03-16 ENCOUNTER — Other Ambulatory Visit: Payer: Self-pay

## 2017-03-16 ENCOUNTER — Encounter: Payer: Self-pay | Admitting: Certified Nurse Midwife

## 2017-03-16 ENCOUNTER — Ambulatory Visit: Payer: BC Managed Care – PPO | Admitting: Certified Nurse Midwife

## 2017-03-16 VITALS — BP 110/72 | HR 64 | Resp 16 | Ht 60.0 in | Wt 135.0 lb

## 2017-03-16 DIAGNOSIS — N898 Other specified noninflammatory disorders of vagina: Secondary | ICD-10-CM | POA: Diagnosis not present

## 2017-03-16 DIAGNOSIS — N952 Postmenopausal atrophic vaginitis: Secondary | ICD-10-CM

## 2017-03-16 NOTE — Progress Notes (Signed)
62 y.o. Married Caucasian female G2P2 here with complaint of vaginal symptoms of itching, burning, and increase discharge. Describes discharge as white, some odor.. Onset of symptoms  days ago. Denies new personal products. Using Premarin cream twice weekly x 3 weeks and after last sexual activity. Afraid to try again until checked to make sure no issues with infection. Treated for BV and yeast previously and completed all medication.  Urinary symptoms no .  ROS Pertinent to HPI  O:Healthy female WDWN Affect: normal, orientation x 3  Exam: Skin: warm and dry Abdomen:soft, non tender  Inguinal Lymph nodes: no enlargement or tenderness Pelvic exam: External genital: normal female, no lesions, scaling or exudate BUS: negative Vagina: scant discharge noted. Premarin cream noted in vaginal vault., no odor, tears or lacerations noted, non tender, Affirm taken Cervix: normal, non tender, no CMT Uterus: normal, non tender Adnexa:normal, non tender, no masses or fullness noted   A:Normal pelvic exam Recent marriage with resuming sexual activity with Premarin use for atrophy, working well in appearance. R/O vaginal infection    P:Discussed findings of normal pelvic and vaginal exam.  Discussed Aveeno sitz bath for comfort.. If working out in gym clothesfor long periods of time change underwear  if possible. Coconut Oil use for skin protection prior to activity can be used to external skin for protection , sexual activity and for lubrication. Questions addressed. Lab affirm will treat if indicate   Rv prn

## 2017-03-18 LAB — VAGINITIS/VAGINOSIS, DNA PROBE
Candida Species: NEGATIVE
Gardnerella vaginalis: NEGATIVE
Trichomonas vaginosis: NEGATIVE

## 2017-04-04 ENCOUNTER — Encounter: Payer: Self-pay | Admitting: Obstetrics & Gynecology

## 2017-04-29 ENCOUNTER — Other Ambulatory Visit: Payer: Self-pay | Admitting: Obstetrics & Gynecology

## 2017-04-29 MED ORDER — ESTRADIOL 0.1 MG/GM VA CREA: TOPICAL_CREAM | VAGINAL | 4 refills | Status: DC

## 2017-04-29 NOTE — Telephone Encounter (Signed)
Medication refill request: estrace vaginal cream Last AEX:  02-14-17 Next AEX: 04-28-18 Last MMG (if hormonal medication request): left breast neg-removed out of hold 2/19 Refill authorized: Rx was sent to [facility] for 7[facility]yr. Pharmacy states they never received rx. Pt would like a new rx sent to [facility]. Please approve rx.

## 2017-04-29 NOTE — Telephone Encounter (Signed)
Patient is asking for a refill of her estradiol cream. Patient would like the prescription called to [facility]. [phone]

## 2017-04-29 NOTE — Telephone Encounter (Signed)
Message left letting patient know that rx was sent to provider to review.

## 2017-05-04 ENCOUNTER — Telehealth: Payer: Self-pay | Admitting: Obstetrics & Gynecology

## 2017-05-04 NOTE — Telephone Encounter (Signed)
Spoke with patient. States since getting married in December has experienced recurrent yeast and bacteria. Using vaginal estradiol, still experiencing symptoms. Reports vaginal irritation, burning, and white milky d/c since 3/23. Denies odor, pain or bleeding. Advised OV recommended for further evaluation, patient request OV with Dr. Hyacinth MeekerMiller or Leota Sauerseborah Leonard, CNM at 4pm.   OV scheduled for 3/28 at 4pm with Leota Sauerseborah Leonard, CNM.   Routing to provider for final review. Patient is agreeable to disposition. Will close encounter.  Cc: Dr. Hyacinth MeekerMiller

## 2017-05-04 NOTE — Telephone Encounter (Signed)
Patient is having vaginal irritation and symptoms as before.

## 2017-05-05 ENCOUNTER — Encounter: Payer: Self-pay | Admitting: Certified Nurse Midwife

## 2017-05-05 ENCOUNTER — Ambulatory Visit: Payer: BC Managed Care – PPO | Admitting: Certified Nurse Midwife

## 2017-05-05 ENCOUNTER — Other Ambulatory Visit: Payer: Self-pay

## 2017-05-05 VITALS — BP 124/78 | HR 70 | Resp 16 | Ht 60.0 in | Wt 137.0 lb

## 2017-05-05 DIAGNOSIS — N898 Other specified noninflammatory disorders of vagina: Secondary | ICD-10-CM

## 2017-05-05 DIAGNOSIS — N949 Unspecified condition associated with female genital organs and menstrual cycle: Secondary | ICD-10-CM | POA: Diagnosis not present

## 2017-05-05 DIAGNOSIS — N9489 Other specified conditions associated with female genital organs and menstrual cycle: Secondary | ICD-10-CM

## 2017-05-05 DIAGNOSIS — N952 Postmenopausal atrophic vaginitis: Secondary | ICD-10-CM

## 2017-05-05 NOTE — Patient Instructions (Signed)
Atrophic Vaginitis Atrophic vaginitis is when the tissues that line the vagina become dry and thin. This is caused by a drop in estrogen. Estrogen helps:  To keep the vagina moist.  To make a clear fluid that helps: ? To lubricate the vagina for sex. ? To protect the vagina from infection.  If the lining of the vagina is dry and thin, it may:  Make sex painful. It may also cause bleeding.  Cause a feeling of: ? Burning. ? Irritation. ? Itchiness.  Make an exam of your vagina painful. It may also cause bleeding.  Make you lose interest in sex.  Cause a burning feeling when you pee.  Make your vaginal fluid (discharge) brown or yellow.  For some women, there are no symptoms. This condition is most common in women who do not get their regular menstrual periods anymore (menopause). This often starts when a woman is 45-55 years old. Follow these instructions at home:  Take medicines only as told by your doctor. Do not use any herbal or alternative medicines unless your doctor says it is okay.  Use over-the-counter products for dryness only as told by your doctor. These include: ? Creams. ? Lubricants. ? Moisturizers.  Do not douche.  Do not use products that can make your vagina dry. These include: ? Scented feminine sprays. ? Scented tampons. ? Scented soaps.  If it hurts to have sex, tell your sexual partner. Contact a doctor if:  Your discharge looks different than normal.  Your vagina has an unusual smell.  You have new symptoms.  Your symptoms do not get better with treatment.  Your symptoms get worse. This information is not intended to replace advice given to you by your health care provider. Make sure you discuss any questions you have with your health care provider. Document Released: 07/14/2007 Document Revised: 07/03/2015 Document Reviewed: 01/16/2014 Elsevier Interactive Patient Education  2018 Elsevier Inc.  

## 2017-05-05 NOTE — Progress Notes (Signed)
62 y.o. Married Caucasian female G2P2 here with complaint of vaginal symptoms of just soreness and slight irritation. Using coconut oil for sexual activity and using Premarin cream twice weekly for dryness. Still trying to adjust to sexual activity after being not sexually active. Spouse patient and working with Astroglide use also, but found this to not work as well as coconut oil. ? Bleeding noted after sex, but did not see any on toliet paper. Spouse larger than previous deceased spouse. Trying different positions also. Denies vaginal odor, just increase discharge and slight burning also. Denies any urinary symptoms. Please check of infection. " Just worried". No other concerns   ROS pertinent to HPI  O:Healthy female WDWN Affect: normal, orientation x 3  Exam:Skin: warm and dry Abdomen:soft, non tender, no masses  Inguinal Lymph nodes: no enlargement or tenderness Pelvic exam: External genital: normal female, atrophic appearance,no redness, scaling or exudate, non tender BUS: negative Vagina: normal appearing non odorous discharge noted. No superficial lacerations noted at introitus, in posterior fornix small abrasion with pink discharge noted, no lesion non tender Affirm taken Cervix: normal, non tender, no CMT Uterus: normal, non tender Adnexa:normal, non tender, no masses or fullness noted Rectal area: no rashes or redness noted   A:Normal pelvic exam Atrophic vaginitis using Premarin cream with good results Vaginal abrasion healing source of bleeding noted Dyspareunia  Improving R/O vaginal infection   P:Discussed findings of small abrasion, feel source of bleeding noted and etiology. Discussed Aveeno or baking soda sitz bath for comfort if needed. Also suggested use after sexual activity if uncomfortable. Discussed otherwise normal exam. Feel she is improving with learning what works for them with sexual activity and encouraged to continue to work together. Recommend coconut oil  vaginal use on nights she does not use Premarin and feel this will help with comfort. Questions addressed at length. Discussed no signs of infection but will await affirm results and treat if indicated. Lab: Affirm   Rv prn  18 minutes in additional discussion regarding sexual activity concerns in addition to exam

## 2017-05-06 ENCOUNTER — Other Ambulatory Visit: Payer: Self-pay

## 2017-05-06 ENCOUNTER — Telehealth: Payer: Self-pay | Admitting: Obstetrics & Gynecology

## 2017-05-06 LAB — VAGINITIS/VAGINOSIS, DNA PROBE
Candida Species: POSITIVE — AB
GARDNERELLA VAGINALIS: POSITIVE — AB
TRICHOMONAS VAG: NEGATIVE

## 2017-05-06 MED ORDER — METRONIDAZOLE 500 MG PO TABS: 500.0000 mg | ORAL_TABLET | Freq: Two times a day (BID) | ORAL | 0 refills | Status: DC

## 2017-05-06 MED ORDER — TERCONAZOLE 0.4 % VA CREA: TOPICAL_CREAM | VAGINAL | 0 refills | Status: DC

## 2017-05-06 NOTE — Telephone Encounter (Signed)
Spoke with [facility]. Advised okay to dispense as Terazol 3 0.8* x 3 days. Pharmacy will process at this time.

## 2017-05-06 NOTE — Telephone Encounter (Signed)
Patient returned call

## 2017-05-06 NOTE — Telephone Encounter (Signed)
Terazol 7 0.4 % x 7 days was sent in today to pharmacy. Pharmacy only has Terazol 3 0.8 % x 3 days. Routing to Dr.Miller for review.

## 2017-05-06 NOTE — Telephone Encounter (Signed)
Prescription received is 4% for 7 days. Pharmacy only has 8% for 3 days. Wants to know if it is okay to replace. Spoke with Scarlett at [facility]. She leaves at 4:30, can talk to anyone.

## 2017-05-06 NOTE — Telephone Encounter (Signed)
lmtcb

## 2017-05-06 NOTE — Telephone Encounter (Signed)
Pt notified of results & rxs sent to pharmacy 

## 2017-05-06 NOTE — Telephone Encounter (Signed)
Ok for pharmacy to dispense this with a 3 day supply.

## 2017-06-21 ENCOUNTER — Encounter: Payer: Self-pay | Admitting: Neurology

## 2017-06-22 ENCOUNTER — Ambulatory Visit: Payer: BC Managed Care – PPO | Admitting: Neurology

## 2017-06-22 ENCOUNTER — Encounter: Payer: Self-pay | Admitting: Neurology

## 2017-06-22 ENCOUNTER — Encounter

## 2017-06-22 VITALS — BP 135/89 | HR 85 | Ht 60.0 in | Wt 136.0 lb

## 2017-06-22 DIAGNOSIS — R0683 Snoring: Secondary | ICD-10-CM | POA: Diagnosis not present

## 2017-06-22 DIAGNOSIS — M542 Cervicalgia: Secondary | ICD-10-CM | POA: Diagnosis not present

## 2017-06-22 DIAGNOSIS — R51 Headache: Secondary | ICD-10-CM | POA: Diagnosis not present

## 2017-06-22 DIAGNOSIS — R519 Headache, unspecified: Secondary | ICD-10-CM

## 2017-06-22 NOTE — Progress Notes (Signed)
SLEEP MEDICINE CLINIC   Provider:  Larey Seat, M D  Primary Care Physician:  Crist Infante, MD   Referring Provider: Crist Infante, MD    Chief Complaint  Patient presents with  . New Patient (Initial Visit)    pt with husband, rm 46. pt states that she had a sleep study 12 years and was started on CPAP.. she used it for roughly about a year and was unable to tolerate. pt states that husband has told her she snores.     HPI:  Joan White is a 62 y.o. female , seen here as a referral from Dr. Joylene Draft for as sleep evaluation,    Chief complaint according to patient : I have the pleasure of meeting Ms. Joan White today on 22 Jun 2017.  Just recently I also met her husband in a sleep consultation.  The patient's married just December 7 last year and Mrs. Cogliano has a long that she is a loud snorer which has surprised her very much.  She does have the ability to breathe and obstructed through the nose she does not have any history of asthma, recent pneumonitis frequent bronchial infections and Dr. Tempie Donning referral note also states that she has never been a smoker and is not exposed to passive smoke.  She has maintained a normal body weight and has no focal neurologic weaknesses.  She noted that as she went through menopausal stages she developed some rather severe migraines but these have no longer affected her at the current time she has 5 days out of the week where she suffers a dull and not very focal headache that seems to arise from the forehead on both sides and is not associated with nausea or vision changes. She reports some neck and shoulder pain.   Sleep habits are as follows: bedtime is between 9.30 and 10, but she tends to fall asleep while watching TV prior to her intended bedtime.  She is usually promptly asleep when she enters her bedroom which is cool, quiet and dark.  She sleeps on her side left or right, she uses one contoured pillow for head and neck  support. She actually would prefer the supine sleep position but the snoring seems to be much louder in that position and she has therefore tried to avoid it. She is not described as a restless sleeper, and she usually has only one bathroom break if any.  She does not recall dreaming vividly or a lot of it. Her night is usually filled by continuous sleep without any interruption, she wakes usually around 7 AM spontaneously and sometimes stays in bed for couple of hours if she has no appointments to attend to.   Sleep medical history and family sleep history: She has a history of migraines that ended after the menopausal years concluded, she had no history of sleepwalking, night terrors or any parasomnias.  She underwent a nasal septoplasty and has regained normal airway patency.  She has never had any traumatic head injuries neck injuries or airway surgeries. Never had tonsils removed.    Social history:  Married since December 2019, retired, had daytime jobs, now keeping the grandchildren twice a week ( age 3 and 63 ). Non smoker, 4-5 glasses a week. Caffeine : coffee 1 cup in AM and 2-3 glasses on iced tea on weekends. No sodas, no energy drinks.    Review of Systems:Out of a complete 14 system review, the patient complains of only the following symptoms,  and all other reviewed systems are negative.  Crescendo snoring, loud snoring.  Headaches , neck pain- couldn't afford PT .    tramadol prn, methcarbamol prn.   Epworth score 16/ 24  , Fatigue severity score 32   , depression score n/a    Social History   Socioeconomic History  . Marital status: Married    Spouse name: Not on file  . Number of children: Not on file  . Years of education: Not on file  . Highest education level: Not on file  Occupational History  . Not on file  Social Needs  . Financial resource strain: Not on file  . Food insecurity:    Worry: Not on file    Inability: Not on file  . Transportation needs:     Medical: Not on file    Non-medical: Not on file  Tobacco Use  . Smoking status: Never Smoker  . Smokeless tobacco: Never Used  Substance and Sexual Activity  . Alcohol use: Yes    Comment: 1/2 glass of wine nightly  . Drug use: No  . Sexual activity: Yes    Partners: Male    Birth control/protection: Surgical, Post-menopausal    Comment: BTSP  Lifestyle  . Physical activity:    Days per week: Not on file    Minutes per session: Not on file  . Stress: Not on file  Relationships  . Social connections:    Talks on phone: Not on file    Gets together: Not on file    Attends religious service: Not on file    Active member of club or organization: Not on file    Attends meetings of clubs or organizations: Not on file    Relationship status: Not on file  . Intimate partner violence:    Fear of current or ex partner: Not on file    Emotionally abused: Not on file    Physically abused: Not on file    Forced sexual activity: Not on file  Other Topics Concern  . Not on file  Social History Narrative  . Not on file    Family History  Problem Relation Age of Onset  . Colon cancer Paternal Grandmother   . Ulcerative colitis Father   . Heart disease Mother   . Hypertension Mother     Past Medical History:  Diagnosis Date  . Abnormal Pap smear    h/o cryo  . Arthritis   . Depression   . HSV infection   . Migraine     Past Surgical History:  Procedure Laterality Date  . BTSP    . CESAREAN SECTION    . GYNECOLOGIC CRYOSURGERY    . NASAL SEPTUM SURGERY    . SHOULDER SURGERY Right 06/2016   Dr. Ronnie Derby     Current Outpatient Medications  Medication Sig Dispense Refill  . Cholecalciferol (VITAMIN D PO) Take 1,000 Int'l Units by mouth.    . Cyanocobalamin (VITAMIN B 12 PO) Take 1,000 mg by mouth.    . DULoxetine (CYMBALTA) 60 MG capsule Take 1 capsule (60 mg total) by mouth daily. 90 capsule 4  . estradiol (ESTRACE) 0.1 MG/GM vaginal cream 1 gram vaginally twice weekly  and apply a small amount externally. 42.5 g 4  . MAGNESIUM PO Take 750 mg by mouth daily.    . medroxyPROGESTERone (PROVERA) 2.5 MG tablet Take 1 tablet (2.5 mg total) by mouth daily. 90 tablet 4  . Multiple Vitamins-Minerals (MULTIVITAMIN PO) Take by mouth  daily.    . terconazole (TERAZOL 7) 0.4 % vaginal cream Insert 1 applicatorful vaginally x 7 days 45 g 0  . valACYclovir (VALTREX) 1000 MG tablet Take 1 tablet (1,000 mg total) by mouth daily. Take as directed. 90 tablet 4   No current facility-administered medications for this visit.     Allergies as of 06/22/2017 - Review Complete 06/22/2017  Allergen Reaction Noted  . Sulfa antibiotics  09/05/2012    Vitals: BP 135/89   Pulse 85   Ht 5' (1.524 m)   Wt 136 lb (61.7 kg)   BMI 26.56 kg/m  Last Weight:  Wt Readings from Last 1 Encounters:  06/22/17 136 lb (61.7 kg)   UMP:NTIR mass index is 26.56 kg/m.     Last Height:   Ht Readings from Last 1 Encounters:  06/22/17 5' (1.524 m)    Physical exam:  General: The patient is awake, alert and appears not in acute distress. The patient is well groomed. Head: Normocephalic, atraumatic. Neck is supple. Mallampati 2,  neck circumference:13 " . Nasal airflow patent , . Retrognathia is not seen.  Cardiovascular:  Regular rate and rhythm , without  murmurs or carotid bruit, and without distended neck veins. Respiratory: Lungs are clear to auscultation. Skin:  Without evidence of edema, or rash Trunk: BMI is 27. The patient's posture is erect   Neurologic exam : The patient is awake and alert, oriented to place and time.    Attention span & concentration ability appears normal.  Speech is fluent,  without dysarthria, dysphonia or aphasia.  Mood and affect are appropriate.  Cranial nerves: Pupils are equal and briskly reactive to light. Funduscopic exam without  evidence of pallor or edema. Extraocular movements  in vertical and horizontal planes intact and without nystagmus.  Visual fields by finger perimetry are intact. Hearing to finger rub intact.  Facial sensation intact to fine touch. Facial motor strength is symmetric and tongue and uvula move midline. Shoulder droop on the right asymmetrical- ROM in right shoulder is limited.   Motor exam:   Normal tone, muscle bulk and symmetric strength in all extremities. Sensory:  Fine touch, pinprick and vibration were tested in all extremities. Proprioception tested in the upper extremities was normal. Coordination: Rapid alternating movements in the fingers/hands was normal. Finger-to-nose maneuver  normal without evidence of ataxia, dysmetria or tremor. Gait and station: Patient walks without assistive device. Deep tendon reflexes: in the  upper and lower extremities are symmetric and intact.   Assessment:  After physical and neurologic examination, review of laboratory studies,  Personal review of imaging studies, reports of other /same  Imaging studies, results of polysomnography and / or neurophysiology testing and pre-existing records as far as provided in visit., my assessment is   1)   Mr. and Mrs. Neises will request a sleep lab visit for the same night so that they can arrive together at least together.  Her loud snoring and her almost chronic headaches would allow for an attended sleep study split at an AHI of 20, CPAP use as needed with a small interface, the patient has normal nasal patency.  Mr. Morrissette has waited for his 63th birthday to arrive which was yesterday.  For him I will order a similar test.   I will meet with the Legrand Como after the tests are interpreted.  I thank Dr. Joylene Draft for this pleasant referral.    Sincerely, C. Zayda Angell   The patient was advised of the nature of the  diagnosed disorder , the treatment options and the  risks for general health and wellness arising from not treating the condition.   I spent more than 45  minutes of face to face time with the patient.  Greater than 50% of  time was spent in counseling and coordination of care. We have discussed the diagnosis and differential and I answered the patient's questions.    Plan:  Treatment plan and additional workup : SPLIT AHI 20, CO2 if available.    Larey Seat, MD 08/27/9468, 9:62 PM  Certified in Neurology by ABPN Certified in [place] by [facility] [address] [place], [place] [address]

## 2017-07-15 ENCOUNTER — Ambulatory Visit (INDEPENDENT_AMBULATORY_CARE_PROVIDER_SITE_OTHER): Payer: BC Managed Care – PPO | Admitting: Neurology

## 2017-07-15 DIAGNOSIS — G4731 Primary central sleep apnea: Secondary | ICD-10-CM | POA: Diagnosis not present

## 2017-07-15 DIAGNOSIS — I493 Ventricular premature depolarization: Secondary | ICD-10-CM

## 2017-07-15 DIAGNOSIS — R0683 Snoring: Secondary | ICD-10-CM

## 2017-07-15 DIAGNOSIS — R519 Headache, unspecified: Secondary | ICD-10-CM

## 2017-07-15 DIAGNOSIS — M542 Cervicalgia: Secondary | ICD-10-CM

## 2017-07-15 DIAGNOSIS — R51 Headache: Secondary | ICD-10-CM

## 2017-07-22 NOTE — Procedures (Signed)
PATIENT'S NAME:  Joan White DOB:      1955-08-27      MR#:    [id number]     DATE OF RECORDING: 07/15/2017 REFERRING M.D.:  Rodrigo Ran, M.D. Study Performed:   Baseline Polysomnogram HISTORY: Joan White is a 62 y.o. female, was seen here as a referral from Dr. Waynard Edwards for a sleep evaluation on 06-22-2017. The patient just married on December 7th, 2018 and Joan White has just learned that she is a loud snorer -which has surprised her very much.  She is excessively daytime sleepy. She does have good nasal patency, does not have asthma, pneumonitis, nor frequent bronchial infections, and she has never been a smoker / is not exposed to passive smoke.  She has maintained a normal body weight and has no focal neurologic weaknesses.  She noted that as she went through menopausal stages she developed some rather severe migraines, but these have no longer affected her. She has 5 days out of the week where she suffers a dull and non-focal headache that seems to arise from the bilateral temple and forehead and is not associated with nausea or vision changes. She reports some neck and shoulder pain.   Snoring, Depression, and Migraine. EDS -The patient endorsed the Epworth Sleepiness Scale at 16/24 points, FSS at 32 points, and geriatric depression score N/A.   The patient's weight 136 pounds with a height of 60 (inches), resulting in a BMI of 26.8 kg/m2. The patient's neck circumference measured 13 inches.  CURRENT MEDICATIONS: Vitamin D, Vitamin B, Cymbalta, Estrace, Magnesium, Provera, Multivitamin, Terazol, Valtrex.   PROCEDURE:  This is a multichannel digital polysomnogram utilizing the Somnostar 11.2 system.  Electrodes and sensors were applied and monitored per AASM Specifications.   EEG, EOG, Chin and Limb EMG, were sampled at 200 Hz.  ECG, Snore and Nasal Pressure, Thermal Airflow, Respiratory Effort, CPAP Flow and Pressure, Oximetry was sampled at 50 Hz. Digital video and audio were  recorded.      BASELINE STUDY: Lights Out was at 22:29 and Lights On at 05:07.  Total recording time (TRT) was 398.5 minutes, with a total sleep time (TST) of 292.5 minutes.  The patient's sleep latency was 13.5 minutes.  REM void.  The sleep efficiency was 73.4 %.     SLEEP ARCHITECTURE: WASO (Wake after sleep onset) was 85.5 minutes.  There were 55 minutes in Stage N1, 237.5 minutes Stage N2, 0 minutes Stage N3 and 0 minutes in Stage REM.  The percentage of Stage N1 was 18.8%, Stage N2 was 81.2%, Stage N3 was 0% and Stage R (REM sleep) was 0%.   RESPIRATORY ANALYSIS:  There were a total of 183 respiratory events:  47 obstructive apneas, 10 central apneas and 0 mixed apneas with 126 hypopneas with 10 respiratory event related arousals (RERAs). The total APNEA/HYPOPNEA INDEX (AHI) was 37.5 /hour and the total RESPIRATORY DISTURBANCE INDEX was 40.5 /hour and all 262 events in NREM. The patient spent 50.5 minutes of total sleep time in the supine position and 242 minutes in non-supine. The supine AHI was 24.9 versus a non-supine AHI of 40.2.  OXYGEN SATURATION & C02:  The Wake baseline 02 saturation was 97%, with the lowest being 82%. Time spent below 89% saturation equaled 13 minutes.   PERIODIC LIMB MOVEMENTS:  The patient had a total of 105 Periodic Limb Movements.  The Periodic Limb Movement (PLM) index was 21.5 and the PLM Arousal index was 4.7/hour.  The arousals were noted  as: 68 were spontaneous, 23 were associated with PLMs, and 71 were associated with respiratory events.  Audio and video analysis did not show any abnormal or unusual movements, behaviors, phonations or vocalizations. The patient was very restless.    EKG was irregular - PVCs-see print screen shot.  Post-study, the patient indicated that sleep was much worse than usual.   IMPRESSION:  1. Severe Complex, mostly Obstructive Sleep Apnea (OSA), non- positional, all sleep in NREM sleep.  2. Severe Periodic Limb Movement  Disorder (PLMD) 3. Primary Snoring in supine, severe RDI (respiratory disturbance index). 4. Variable EKG but in sinus rhythm- isolated PVCs.  RECOMMENDATIONS:  1. Advise full-night, attended, PAP titration study to optimize therapy. We may have to use BiPAP if treatment causes central apneas to emerge. 2. PLM may resolve once apnea is treated, if not will treat with medication.     I certify that I have reviewed the entire raw data recording prior to the issuance of this report in accordance with the Standards of Accreditation of the American Academy of Sleep Medicine (AASM)    Melvyn Novasarmen Jubal Rademaker, MD   07-21-2017 Diplomat, American Board of Psychiatry and Neurology  Diplomat, American Board of Sleep Medicine Medical Director, [facility]

## 2017-07-22 NOTE — Addendum Note (Signed)
Addended by: Melvyn NovasHMEIER, Blaiden Werth on: 07/22/2017 12:38 PM   Modules accepted: Orders

## 2017-07-25 ENCOUNTER — Telehealth: Payer: Self-pay

## 2017-07-25 ENCOUNTER — Telehealth: Payer: Self-pay | Admitting: Neurology

## 2017-07-25 NOTE — Telephone Encounter (Signed)
-----   Message from Melvyn Novasarmen Dohmeier, MD sent at 07/22/2017 12:38 PM EDT ----- IMPRESSION:  1. Severe Complex, mostly Obstructive Sleep Apnea (OSA), non-  positional, all sleep in NREM sleep. No significant hypoxemia noted.  2. Severe Periodic Limb Movement Disorder (PLMD) 3. Primary Snoring in supine, severe RDI (respiratory disturbance  index). 4. Variable EKG but in sinus rhythm- isolated PVCs. 5. REM void sleep architecture likely related to medication ( Cymbalta)   RECOMMENDATIONS:  1. Advise full-night, attended, PAP titration study to optimize  therapy. We may have to use BiPAP if treatment causes central  apneas to emerge. 2. PLM may resolve once apnea is treated- if not responding to PAP will treat with  medication.

## 2017-07-25 NOTE — Telephone Encounter (Signed)
Called patient to discuss sleep study results. No answer at this time. LVM for the patient to call back.   

## 2017-07-25 NOTE — Telephone Encounter (Signed)
Called patient to schedule CPAP titration.  Explained results of baseline sleep study.  Pt understood and had no questions.  Pt scheduled CPAP titration study for 08/14/17 @ 8pm

## 2017-08-14 ENCOUNTER — Ambulatory Visit (INDEPENDENT_AMBULATORY_CARE_PROVIDER_SITE_OTHER): Payer: BC Managed Care – PPO | Admitting: Neurology

## 2017-08-14 DIAGNOSIS — R0683 Snoring: Secondary | ICD-10-CM

## 2017-08-14 DIAGNOSIS — I493 Ventricular premature depolarization: Secondary | ICD-10-CM

## 2017-08-14 DIAGNOSIS — G4731 Primary central sleep apnea: Secondary | ICD-10-CM

## 2017-08-26 NOTE — Procedures (Signed)
PATIENT'S NAME:  Joan White, Joan White DOB:      11/22/1955      MR#:    [id number]     DATE OF RECORDING: 08/14/2017 REFERRING M.D.:  Joan Ran, MD Study Performed:   Titration to positive airway pressure.  HISTORY:   Joan White is a 62 y.o. female with a concern of loud snoring and she is excessively daytime sleepy.  Patient returned for CPAP titration after her diagnostic PSG from 07/15/2017 confirmed moderate- severe sleep apnea at an AHI of 37.5 /hour, additional snoring brought the RDI to 40.5 /hour. Oxygen nadir was 82%, desaturation time equaled 13 minutes. Snoring, Depression, and Migraine. The patient endorsed the Epworth Sleepiness Scale at 16/24 points. The patient's weight 137 pounds with a height of 60 (inches), resulting in a BMI of 26.8 kg/m2. The patient's neck circumference measured 13 inches.  CURRENT MEDICATIONS: Vitamin D, Vitamin B, Cymbalta, Estrace, Magnesium, Provera, Multivitamin, Terazol, Valtrex.  PROCEDURE:  This is a multichannel digital polysomnogram utilizing the SomnoStar 11.2 system.  Electrodes and sensors were applied and monitored per AASM Specifications.   EEG, EOG, Chin and Limb EMG, were sampled at 200 Hz.  ECG, Snore and Nasal Pressure, Thermal Airflow, Respiratory Effort, CPAP Flow and Pressure, Oximetry was sampled at 50 Hz. Digital video and audio were recorded.      CPAP was initiated at 5 cmH20 with heated humidity per AASM split night standards and pressure was advanced to 7/7cmH20 because of hypopneas, apneas and desaturations.  At a PAP pressure of 7 cmH20, there was a reduction of the AHI to 0.0 with improvement of sleep apnea. Lights Out was at 00:02 and Lights On at 07:16. Total recording time (TRT) was 435 minutes, with a total sleep time (TST) of 388.5 minutes. The patient's sleep latency was 17.5 minutes. REM latency was 305.5 minutes.  The sleep efficiency was 89.3 %.    SLEEP ARCHITECTURE: WASO (Wake after sleep onset) was 34 minutes.  There  were 34 minutes in Stage N1, 325 minutes Stage N2, 0 minutes Stage N3 and 29.5 minutes in Stage REM.  The percentage of Stage N1 was 8.8%, Stage N2 was 83.7%, Stage N3 was 0% and Stage R (REM sleep) was 7.6%.  The sleep architecture was notable for low REM sleep proportion.  RESPIRATORY ANALYSIS:  There was a total of 2 respiratory events: 0 obstructive apneas, 0 central apneas and 0 mixed apneas with a total of 0 apneas and an apnea index (AI) of 0 /hour. There were 2 hypopneas with a hypopnea index of .3/hour. The patient also had 0 respiratory event related arousals (RERAs).      The total APNEA/HYPOPNEA INDEX (AHI) was 0.3 /hour and the total RESPIRATORY DISTURBANCE INDEX was 0.3 /hour.  0 events occurred in REM sleep and 2 events in NREM. The REM AHI was 0.0 /hour versus a non-REM AHI of 0.3/hour.  The patient spent 161 minutes of total sleep time in the supine position and 228 minutes in non-supine. The supine AHI was 0.0, versus a non-supine AHI of 0.5.  OXYGEN SATURATION & C02:  The baseline 02 saturation was 96%, with the lowest being 78%. Time spent below 89% saturation equaled 16 minutes.  PERIODIC LIMB MOVEMENTS:  The patient had a total of 59 Periodic Limb Movements. The Periodic Limb Movement (PLM) index was 9.1 and the PLM Arousal index was 5.3 /hour. The arousals were noted as: 103 were spontaneous, 34 were associated with PLMs, and 2 were associated  with respiratory events. Audio and video analysis did not show any abnormal or unusual movements, behaviors, phonations or vocalizations.   No nocturia.  EKG was in keeping with normal sinus rhythm (NSR).  PVCs were much less frequent than in baseline PSG.   Post-study, the patient indicated that sleep was better than expected.   DIAGNOSIS 1. Complex Sleep Apnea responding very well to 6 and 7 cm water CPAP with resolution of apnea, high sleep efficiency. The patient was fitted with a BaristaesMed Air Fit P10 (Med) nasal  pillow.   PLANS/RECOMMENDATIONS: The patient will use auto titration CPAP at 4-10 cm water with a nasal interface- I prefer her to be fitted for an N 30 I, and use heated humidity.     1. The patient should avoid evening sedatives, hypnotics, and alcohol beverage consumption if applicable. 2.   DISCUSSION: Please remind the patient of the compliance requirement with CPAP therapy - 4 hours or more of nightly use. If logistical problems, technical difficulties or discomfort interfere with CPAP use, please contact DME immediately.   A follow up appointment will be scheduled in [facility].   Please call [phone] with any questions.     I certify that I have reviewed the entire raw data recording prior to the issuance of this report in accordance with the Standards of Accreditation of the American Academy of Sleep Medicine (AASM)    Joan White, M.D.  08-26-2017  Diplomat, American Board of Psychiatry and Neurology  Diplomat, American Board of Sleep Medicine Medical Director, [facility]

## 2017-08-26 NOTE — Addendum Note (Signed)
Addended by: Melvyn NovasHMEIER, Madiha Bambrick on: 08/26/2017 01:30 PM   Modules accepted: Orders

## 2017-08-29 ENCOUNTER — Telehealth: Payer: Self-pay | Admitting: Neurology

## 2017-08-29 NOTE — Telephone Encounter (Signed)
-----   Message from Melvyn Novasarmen Dohmeier, MD sent at 08/26/2017  1:30 PM EDT ----- DIAGNOSIS 1. Complex Sleep Apnea responding very well to 6 and 7 cm water  CPAP with resolution of apnea, high sleep efficiency. The patient  was fitted with a BaristaesMed Air Fit P10 (Med) nasal pillow.   PLANS/RECOMMENDATIONS: The patient will use auto titration CPAP  at 4-10 cm water with a nasal interface- I prefer her to be  fitted for an N 30 I, and use heated humidity.   1. The patient should avoid evening sedatives, hypnotics, and  alcohol beverage consumption- if applicable. 2.  DISCUSSION: Please remind the patient of the compliance  requirement with CPAP therapy - 4 hours or more of nightly use.  If logistical problems, technical difficulties or discomfort  interfere with CPAP use, please contact DME immediately.

## 2017-08-29 NOTE — Telephone Encounter (Signed)
I called pt. I advised pt that Dr. Vickey Hugerohmeier reviewed their sleep study results and found that pt has sleep apnea. Dr. Vickey Hugerohmeier recommends that pt starts a auto CPAP. I reviewed PAP compliance expectations with the pt. Pt is agreeable to starting a CPAP. I advised pt that an order will be sent to a DME, [facility], and [facility] will call the pt within about one week after they file with the pt's insurance. [facility] will show the pt how to use the machine, fit for masks, and troubleshoot the CPAP if needed. A follow up appt was made for insurance purposes with Dr. Vickey Hugerohmeier on Oct 22,2019 at 1:30 pm. Pt verbalized understanding to arrive 15 minutes early and bring their CPAP. A letter with all of this information in it will be mailed to the pt as a reminder. I verified with the pt that the address we have on file is correct. Pt verbalized understanding of results. Pt had no questions at this time but was encouraged to call back if questions arise.

## 2017-09-28 ENCOUNTER — Other Ambulatory Visit: Payer: Self-pay | Admitting: Internal Medicine

## 2017-09-28 DIAGNOSIS — M4302 Spondylolysis, cervical region: Secondary | ICD-10-CM

## 2017-09-28 DIAGNOSIS — M1990 Unspecified osteoarthritis, unspecified site: Secondary | ICD-10-CM

## 2017-10-17 ENCOUNTER — Ambulatory Visit
Admission: RE | Admit: 2017-10-17 | Discharge: 2017-10-17 | Disposition: A | Discharge status: Home / Self Care | Payer: BC Managed Care – PPO | Source: Ambulatory Visit | Attending: Internal Medicine | Admitting: Internal Medicine

## 2017-10-17 DIAGNOSIS — M1990 Unspecified osteoarthritis, unspecified site: Secondary | ICD-10-CM

## 2017-10-17 DIAGNOSIS — M4302 Spondylolysis, cervical region: Secondary | ICD-10-CM

## 2017-11-27 ENCOUNTER — Encounter: Payer: Self-pay | Admitting: Nurse Practitioner

## 2017-11-28 NOTE — Progress Notes (Signed)
[facility]  PATIENT: Joan White DOB: Apr 09, 1955   REASON FOR VISIT: Obstructive sleep apnea with initial CPAP HISTORY FROM: Patient    HISTORY OF PRESENT ILLNESS:UPDATE 10/22/2019[name], 62 year old female returns for follow-up with newly diagnosed obstructive sleep apnea here for initial CPAP compliance.  She has had her machine about 2 months, her only problem is with the mask.  She has not called the equipment company.  Compliance data dated 10/29/2017-11/27/2017 shows compliance greater than 4 hours at 97%.  Average usage 8 hours 51 minutes.  Set pressure 7 to 10 cm.  EPR level 3 leak 95th percentile 35.0.  AHI 5.6 she returns for reevaluation  5/15/19CDMs. Joan White today on 22 Jun 2017.  Just recently I also met her husband in a sleep consultation.  The patient's married just December 7 last year and Joan White has a long that she is a loud snorer which has surprised her very much.  She does have the ability to breathe and obstructed through the nose she does not have any history of asthma, recent pneumonitis frequent bronchial infections and Dr. Tempie Donning referral note also states that she has never been a smoker and is not exposed to passive smoke.  She has maintained a normal body weight and has no focal neurologic weaknesses.  She noted that as she went through menopausal stages she developed some rather severe migraines but these have no longer affected her at the current time she has 5 days out of the week where she suffers a dull and not very focal headache that seems to arise from the forehead on both sides and is not associated with nausea or vision changes. She reports some neck and shoulder pain.   Sleep habits are as follows: bedtime is between 9.30 and 10, but she tends to fall asleep while watching TV prior to her intended bedtime.  She is usually promptly asleep when she enters her bedroom which is cool, quiet and dark.  She sleeps on  her side left or right, she uses one contoured pillow for head and neck support. She actually would prefer the supine sleep position but the snoring seems to be much louder in that position and she has therefore tried to avoid it. She is not described as a restless sleeper, and she usually has only one bathroom break if any.  She does not recall dreaming vividly or a lot of it. Her night is usually filled by continuous sleep without any interruption, she wakes usually around 7 AM spontaneously and sometimes stays in bed for couple of hours if she has no appointments to attend to.    REVIEW OF SYSTEMS: Full 14 system review of systems performed and notable only for those listed, all others are neg:  Constitutional: neg  Cardiovascular: neg Ear/Nose/Throat: neg  Skin: neg Eyes: neg Respiratory: neg Gastroitestinal: neg  Hematology/Lymphatic: neg  Endocrine: neg Musculoskeletal:neg Allergy/Immunology: neg Neurological: neg Psychiatric: neg Sleep : Obstructive sleep apnea with CPAP   ALLERGIES: Allergies  Allergen Reactions  . Sulfa Antibiotics     HOME MEDICATIONS: Outpatient Medications Prior to Visit  Medication Sig Dispense Refill  . Cholecalciferol (VITAMIN D PO) Take 1,000 Int'l Units by mouth.    . Cyanocobalamin (VITAMIN B 12 PO) Take 1,000 mg by mouth.    . DULoxetine (CYMBALTA) 60 MG capsule Take 1 capsule (60 mg total) by mouth daily. 90 capsule 4  . estradiol (ESTRACE) 0.1 MG/GM vaginal cream 1 gram vaginally twice weekly  and apply a small amount externally. 42.5 g 4  . MAGNESIUM PO Take 750 mg by mouth daily.    . medroxyPROGESTERone (PROVERA) 2.5 MG tablet Take 1 tablet (2.5 mg total) by mouth daily. 90 tablet 4  . Multiple Vitamins-Minerals (MULTIVITAMIN PO) Take by mouth daily.    . valACYclovir (VALTREX) 1000 MG tablet Take 1 tablet (1,000 mg total) by mouth daily. Take as directed. 90 tablet 4  . terconazole (TERAZOL 7) 0.4 % vaginal cream Insert 1  applicatorful vaginally x 7 days 45 g 0   No facility-administered medications prior to visit.     PAST MEDICAL HISTORY: Past Medical History:  Diagnosis Date  . Abnormal Pap smear    h/o cryo  . Arthritis   . Depression   . HSV infection   . Migraine     PAST SURGICAL HISTORY: Past Surgical History:  Procedure Laterality Date  . BTSP    . CESAREAN SECTION    . GYNECOLOGIC CRYOSURGERY    . NASAL SEPTUM SURGERY    . SHOULDER SURGERY Right 06/2016   Dr. Ronnie Derby     FAMILY HISTORY: Family History  Problem Relation Age of Onset  . Colon cancer Paternal Grandmother   . Ulcerative colitis Father   . Heart disease Mother   . Hypertension Mother     SOCIAL HISTORY: Social History   Socioeconomic History  . Marital status: Married    Spouse name: Not on file  . Number of children: Not on file  . Years of education: Not on file  . Highest education level: Not on file  Occupational History  . Not on file  Social Needs  . Financial resource strain: Not on file  . Food insecurity:    Worry: Not on file    Inability: Not on file  . Transportation needs:    Medical: Not on file    Non-medical: Not on file  Tobacco Use  . Smoking status: Never Smoker  . Smokeless tobacco: Never Used  Substance and Sexual Activity  . Alcohol use: Yes    Comment: 1/2 glass of wine nightly  . Drug use: No  . Sexual activity: Yes    Partners: Male    Birth control/protection: Surgical, Post-menopausal    Comment: BTSP  Lifestyle  . Physical activity:    Days per week: Not on file    Minutes per session: Not on file  . Stress: Not on file  Relationships  . Social connections:    Talks on phone: Not on file    Gets together: Not on file    Attends religious service: Not on file    Active member of club or organization: Not on file    Attends meetings of clubs or organizations: Not on file    Relationship status: Not on file  . Intimate partner violence:    Fear of current or ex  partner: Not on file    Emotionally abused: Not on file    Physically abused: Not on file    Forced sexual activity: Not on file  Other Topics Concern  . Not on file  Social History Narrative  . Not on file     PHYSICAL EXAM  Vitals:   11/29/17 1451  BP: 130/79  Pulse: 87  Weight: 139 lb 12.8 oz (63.4 kg)  Height: 5' (1.524 m)   Body mass index is 27.3 kg/m.  Generalized: Well developed, in no acute distress  Head: normocephalic and atraumatic,. Oropharynx benign  mallopatti 2 Neck: Supple, circumference 13 Cardiac: Regular rate rhythm, no murmur  Lungs clear Musculoskeletal: No deformity  Skin no rash or edema Neurological examination   Mentation: Alert oriented to time, place, history taking. Attention span and concentration appropriate. Recent and remote memory intact.  Follows all commands speech and language fluent.   Cranial nerve II-XII: Pupils were equal round reactive to light extraocular movements were full, visual field were full on confrontational test. Facial sensation and strength were normal. hearing was intact to finger rubbing bilaterally. Uvula tongue midline. head turning and shoulder shrug were normal and symmetric.Tongue protrusion into cheek strength was normal. Motor: normal bulk and tone, full strength in the BUE, BLE, Sensory: normal and symmetric to light touch,  Coordination: finger-nose-finger, heel-to-shin bilaterally, no dysmetria Gait and Station: Rising up from seated position without assistance, normal stance,  moderate stride, good arm swing, smooth turning, able to perform tiptoe, and heel walking without difficulty. Tandem gait is unsteady  DIAGNOSTIC DATA (LABS, IMAGING, TESTING) - I reviewed patient records, labs, notes, testing and imaging myself where available.  Lab Results  Component Value Date   HGB 11.8 (L) 11/16/2013      Component Value Date/Time   NA 135 11/16/2013 1520   K 3.8 11/16/2013 1520   CL 97 11/16/2013 1520    CO2 28 11/16/2013 1520   GLUCOSE 86 11/16/2013 1520   BUN 14 11/16/2013 1520   CREATININE 0.70 11/16/2013 1520   CALCIUM 9.8 11/16/2013 1520   PROT 7.4 11/16/2013 1520   ALBUMIN 4.5 11/16/2013 1520   AST 21 11/16/2013 1520   ALT 16 11/16/2013 1520   ALKPHOS 75 11/16/2013 1520   BILITOT 0.3 11/16/2013 1520   Lab Results  Component Value Date   CHOL 132 11/16/2013   HDL 69 11/16/2013   LDLCALC 41 11/16/2013   TRIG 109 11/16/2013   CHOLHDL 1.9 11/16/2013    Lab Results  Component Value Date   TSH 1.379 11/16/2013      ASSESSMENT AND PLAN  62 y.o. year old female  has a past medical history of Abnormal Pap smear, Arthritis, Depression, HSV infection, and Migraine. here to follow-up for newly diagnosed obstructive sleep apnea here for initial CPAP compliance.Data dated 10/29/2017-11/27/2017 shows compliance greater than 4 hours at 97%.  Average usage 8 hours 51 minutes.  Set pressure 7 to 10 cm.  EPR level 3 leak 95th percentile 35.0.  AHI 5.6  CPAP compliance 97% Continue same settings Needs  mask refit significant leak Follow-up in 3 months Joan Bible, [facility], [facility], APRN  [facility] [address], [place], [place] [address] [phone]

## 2017-11-29 ENCOUNTER — Encounter: Payer: Self-pay | Admitting: Nurse Practitioner

## 2017-11-29 ENCOUNTER — Ambulatory Visit: Payer: BC Managed Care – PPO | Admitting: Nurse Practitioner

## 2017-11-29 ENCOUNTER — Ambulatory Visit: Payer: Self-pay | Admitting: Neurology

## 2017-11-29 DIAGNOSIS — Z9989 Dependence on other enabling machines and devices: Secondary | ICD-10-CM

## 2017-11-29 DIAGNOSIS — G4733 Obstructive sleep apnea (adult) (pediatric): Secondary | ICD-10-CM | POA: Diagnosis not present

## 2017-11-29 NOTE — Patient Instructions (Signed)
CPAP compliance 97% Continue same settings Needs  mask refit significant leak Follow-up in 3 months

## 2017-11-29 NOTE — Progress Notes (Signed)
Sent community message to to [employer] for mask refit cpap order.  sy

## 2018-03-14 ENCOUNTER — Encounter: Payer: Self-pay | Admitting: Neurology

## 2018-03-15 NOTE — Progress Notes (Signed)
[facility]  PATIENT: Joan White DOB: 03/05/1955   REASON FOR VISIT: Obstructive sleep apnea for  CPAP compliance HISTORY FROM: Patient    HISTORY OF PRESENT ILLNESS:UPDATE 2/6/2020[name], 63 year old female returns for follow-up with a history of obstructive sleep apnea here for CPAP compliance.  She is doing well with her machine.  Compliance data dated 02/13/2018-03/14/2018 shows compliance greater than 4 hours at 97%.  Average usage 8 hours 44 minutes.  Set pressure 7 to 10 cm.  AHI 5.1 ESS 5.  FSS 15.She returns for reevaluation    UPDATE 10/22/2019[name], 63 year old female returns for follow-up with newly diagnosed obstructive sleep apnea here for initial CPAP compliance.  She has had her machine about 2 months, her only problem is with the mask.  She has not called the equipment company.  Compliance data dated 10/29/2017-11/27/2017 shows compliance greater than 4 hours at 97%.  Average usage 8 hours 51 minutes.  Set pressure 7 to 10 cm.  EPR level 3 leak 95th percentile 35.0.  AHI 5.6 she returns for reevaluation  5/15/19CDMs. Joan White today on 22 Jun 2017.  Just recently I also met her husband in a sleep consultation.  The patient's married just December 7 last year and Joan White has a long that she is a loud snorer which has surprised her very much.  She does have the ability to breathe and obstructed through the nose she does not have any history of asthma, recent pneumonitis frequent bronchial infections and Dr. Tempie Donning referral note also states that she has never been a smoker and is not exposed to passive smoke.  She has maintained a normal body weight and has no focal neurologic weaknesses.  She noted that as she went through menopausal stages she developed some rather severe migraines but these have no longer affected her at the current time she has 5 days out of the week where she suffers a dull and not very focal headache that seems  to arise from the forehead on both sides and is not associated with nausea or vision changes. She reports some neck and shoulder pain.   Sleep habits are as follows: bedtime is between 9.30 and 10, but she tends to fall asleep while watching TV prior to her intended bedtime.  She is usually promptly asleep when she enters her bedroom which is cool, quiet and dark.  She sleeps on her side left or right, she uses one contoured pillow for head and neck support. She actually would prefer the supine sleep position but the snoring seems to be much louder in that position and she has therefore tried to avoid it. She is not described as a restless sleeper, and she usually has only one bathroom break if any.  She does not recall dreaming vividly or a lot of it. Her night is usually filled by continuous sleep without any interruption, she wakes usually around 7 AM spontaneously and sometimes stays in bed for couple of hours if she has no appointments to attend to.    REVIEW OF SYSTEMS: Full 14 system review of systems performed and notable only for those listed, all others are neg:  Constitutional: neg  Cardiovascular: neg Ear/Nose/Throat: neg  Skin: neg Eyes: neg Respiratory: neg Gastroitestinal: neg  Hematology/Lymphatic: neg  Endocrine: neg Musculoskeletal:neg Allergy/Immunology: neg Neurological: neg Psychiatric: neg Sleep : Obstructive sleep apnea with CPAP   ALLERGIES: Allergies  Allergen Reactions  . Sulfa Antibiotics     HOME MEDICATIONS:  Outpatient Medications Prior to Visit  Medication Sig Dispense Refill  . Cholecalciferol (VITAMIN D PO) Take 1,000 Int'l Units by mouth.    . Cyanocobalamin (VITAMIN B 12 PO) Take 1,000 mg by mouth.    . DULoxetine (CYMBALTA) 60 MG capsule Take 1 capsule (60 mg total) by mouth daily. 90 capsule 4  . estradiol (ESTRACE) 0.5 MG tablet Take 0.5 mg by mouth daily.    Marland Kitchen MAGNESIUM PO Take 750 mg by mouth daily.    . medroxyPROGESTERone  (PROVERA) 2.5 MG tablet Take 1 tablet (2.5 mg total) by mouth daily. 90 tablet 4  . Multiple Vitamins-Minerals (MULTIVITAMIN PO) Take by mouth daily.    . valACYclovir (VALTREX) 1000 MG tablet Take 1 tablet (1,000 mg total) by mouth daily. Take as directed. 90 tablet 4  . estradiol (ESTRACE) 0.1 MG/GM vaginal cream 1 gram vaginally twice weekly and apply a small amount externally. 42.5 g 4   No facility-administered medications prior to visit.     PAST MEDICAL HISTORY: Past Medical History:  Diagnosis Date  . Abnormal Pap smear    h/o cryo  . Arthritis   . Depression   . HSV infection   . Migraine     PAST SURGICAL HISTORY: Past Surgical History:  Procedure Laterality Date  . BTSP    . CESAREAN SECTION    . GYNECOLOGIC CRYOSURGERY    . NASAL SEPTUM SURGERY    . SHOULDER SURGERY Right 06/2016   Dr. Ronnie Derby     FAMILY HISTORY: Family History  Problem Relation Age of Onset  . Colon cancer Paternal Grandmother   . Ulcerative colitis Father   . Heart disease Mother   . Hypertension Mother     SOCIAL HISTORY: Social History   Socioeconomic History  . Marital status: Married    Spouse name: Not on file  . Number of children: Not on file  . Years of education: Not on file  . Highest education level: Not on file  Occupational History  . Not on file  Social Needs  . Financial resource strain: Not on file  . Food insecurity:    Worry: Not on file    Inability: Not on file  . Transportation needs:    Medical: Not on file    Non-medical: Not on file  Tobacco Use  . Smoking status: Never Smoker  . Smokeless tobacco: Never Used  Substance and Sexual Activity  . Alcohol use: Yes    Comment: 1/2 glass of wine nightly  . Drug use: No  . Sexual activity: Yes    Partners: Male    Birth control/protection: Surgical, Post-menopausal    Comment: BTSP  Lifestyle  . Physical activity:    Days per week: Not on file    Minutes per session: Not on file  . Stress: Not on  file  Relationships  . Social connections:    Talks on phone: Not on file    Gets together: Not on file    Attends religious service: Not on file    Active member of club or organization: Not on file    Attends meetings of clubs or organizations: Not on file    Relationship status: Not on file  . Intimate partner violence:    Fear of current or ex partner: Not on file    Emotionally abused: Not on file    Physically abused: Not on file    Forced sexual activity: Not on file  Other Topics Concern  .  Not on file  Social History Narrative  . Not on file     PHYSICAL EXAM  Vitals:   03/16/18 1119  BP: 133/83  Pulse: 93  Weight: 146 lb (66.2 kg)  Height: 5' (1.524 m)   Body mass index is 28.51 kg/m.  Generalized: Well developed, in no acute distress  Head: normocephalic and atraumatic,. Oropharynx benign mallopatti 2 Neck: Supple, circumference 13 Lungs clear Musculoskeletal: No deformity  Skin no rash or edema Neurological examination   Mentation: Alert oriented to time, place, history taking. Attention span and concentration appropriate. Recent and remote memory intact.  Follows all commands speech and language fluent.   Cranial nerve II-XII: Pupils were equal round reactive to light extraocular movements were full, visual field were full on confrontational test. Facial sensation and strength were normal. hearing was intact to finger rubbing bilaterally. Uvula tongue midline. head turning and shoulder shrug were normal and symmetric.Tongue protrusion into cheek strength was normal. Motor: normal bulk and tone, full strength in the BUE, BLE, Sensory: normal and symmetric to light touch,  Coordination: finger-nose-finger, heel-to-shin bilaterally, no dysmetria Gait and Station: Rising up from seated position without assistance, normal stance,  moderate stride, good arm swing, smooth turning, able to perform tiptoe, and heel walking without difficulty. Tandem gait is  steady  DIAGNOSTIC DATA (LABS, IMAGING, TESTING) - I reviewed patient records, labs, notes, testing and imaging myself where available.  Lab Results  Component Value Date   HGB 11.8 (L) 11/16/2013      Component Value Date/Time   NA 135 11/16/2013 1520   K 3.8 11/16/2013 1520   CL 97 11/16/2013 1520   CO2 28 11/16/2013 1520   GLUCOSE 86 11/16/2013 1520   BUN 14 11/16/2013 1520   CREATININE 0.70 11/16/2013 1520   CALCIUM 9.8 11/16/2013 1520   PROT 7.4 11/16/2013 1520   ALBUMIN 4.5 11/16/2013 1520   AST 21 11/16/2013 1520   ALT 16 11/16/2013 1520   ALKPHOS 75 11/16/2013 1520   BILITOT 0.3 11/16/2013 1520   Lab Results  Component Value Date   CHOL 132 11/16/2013   HDL 69 11/16/2013   LDLCALC 41 11/16/2013   TRIG 109 11/16/2013   CHOLHDL 1.9 11/16/2013    Lab Results  Component Value Date   TSH 1.379 11/16/2013      ASSESSMENT AND PLAN  63 y.o. year old female  has a past medical history of Abnormal Pap smear, Arthritis, Depression, HSV infection, and Migraine. here to follow-up for  obstructive sleep apnea here for CPAP compliance.Compliance data dated 02/13/2018-03/14/2018 shows compliance greater than 4 hours at 97%.  Average usage 8 hours 44 minutes.  Set pressure 7 to 10 cm.  AHI 5.1 ESS 5.  FSS 15.  PLAN: CPAP compliance 97% Continue same settings Follow-up yearly Dennie Bible, [facility], [facility], APRN  [facility] [address] [place], [place] [address] [phone]

## 2018-03-16 ENCOUNTER — Ambulatory Visit: Payer: BC Managed Care – PPO | Admitting: Nurse Practitioner

## 2018-03-16 ENCOUNTER — Encounter: Payer: Self-pay | Admitting: Nurse Practitioner

## 2018-03-16 VITALS — BP 133/83 | HR 93 | Ht 60.0 in | Wt 146.0 lb

## 2018-03-16 DIAGNOSIS — Z9989 Dependence on other enabling machines and devices: Secondary | ICD-10-CM | POA: Diagnosis not present

## 2018-03-16 DIAGNOSIS — G4733 Obstructive sleep apnea (adult) (pediatric): Secondary | ICD-10-CM

## 2018-03-16 NOTE — Patient Instructions (Signed)
CPAP compliance 97% Continue same settings Follow-up yearly  

## 2018-04-14 ENCOUNTER — Ambulatory Visit: Payer: BC Managed Care – PPO | Admitting: Obstetrics & Gynecology

## 2018-04-14 ENCOUNTER — Other Ambulatory Visit (HOSPITAL_COMMUNITY)
Admission: RE | Admit: 2018-04-14 | Discharge: 2018-04-14 | Disposition: A | Discharge status: Home / Self Care | Payer: BC Managed Care – PPO | Source: Ambulatory Visit | Attending: Obstetrics & Gynecology | Admitting: Obstetrics & Gynecology

## 2018-04-14 ENCOUNTER — Other Ambulatory Visit: Payer: Self-pay

## 2018-04-14 ENCOUNTER — Encounter: Payer: Self-pay | Admitting: Obstetrics & Gynecology

## 2018-04-14 VITALS — BP 144/84 | HR 88 | Resp 16 | Ht 60.0 in | Wt 146.0 lb

## 2018-04-14 DIAGNOSIS — Z124 Encounter for screening for malignant neoplasm of cervix: Secondary | ICD-10-CM | POA: Diagnosis not present

## 2018-04-14 DIAGNOSIS — Z01419 Encounter for gynecological examination (general) (routine) without abnormal findings: Secondary | ICD-10-CM

## 2018-04-14 MED ORDER — ESTRADIOL 0.5 MG PO TABS: 0.5000 mg | ORAL_TABLET | Freq: Every day | ORAL | 4 refills | Status: DC

## 2018-04-14 MED ORDER — LIDOCAINE 5 % EX OINT: TOPICAL_OINTMENT | CUTANEOUS | 0 refills | Status: DC

## 2018-04-14 MED ORDER — VALACYCLOVIR HCL 1 G PO TABS: 1000.0000 mg | ORAL_TABLET | Freq: Every day | ORAL | 4 refills | Status: DC

## 2018-04-14 MED ORDER — MEDROXYPROGESTERONE ACETATE 2.5 MG PO TABS: 2.5000 mg | ORAL_TABLET | Freq: Every day | ORAL | 4 refills | Status: DC

## 2018-04-14 NOTE — Progress Notes (Signed)
63 y.o. G2P2 Married White or Caucasian female here for annual exam.  Has been seeing Dr. Vickey White due to sleep apnea.  Using CPAP.  She feels she has more energy and is not as tired in the morning.    Frustrate with weight right now. Exercising regularly.  Having urinary leakage with coughing, laughing, sneezing.  Would like to consider options.    Having some issues with vaginal dryness.  She used some vaginal estrogen cream but she had recurrent vaginitis issues.    PCP:  Dr. Waynard White.  Had blood work in December.  Cholesterol was normal.  Reports her blood sugar was a little elevated.    No LMP recorded. Patient is postmenopausal.          Sexually active: Yes.    The current method of family planning is post menopausal status.    Exercising: Yes.    walk, weights Smoker:  no  Health Maintenance: Pap:  12/30/15 Neg. HR HPV:neg   11/16/13 neg  History of abnormal Pap:  Yes, h/o Cryo MMG:  02/17/18 BIRADS1:neg  Colonoscopy:  2017 Normal. F/u 10 years  BMD:   2019 with PCP TDaP:  2016 Pneumonia vaccine(s):  done Shingrix:   completed Hep C testing: 12/30/15 neg  Screening Labs: PCP    reports that she has never smoked. She has never used smokeless tobacco. She reports current alcohol use of about 2.0 standard drinks of alcohol per week. She reports that she does not use drugs.  Past Medical History:  Diagnosis Date  . Abnormal Pap smear    h/o cryo  . Arthritis   . Depression   . HSV infection   . Migraine     Past Surgical History:  Procedure Laterality Date  . BTSP    . CATARACT EXTRACTION, BILATERAL  2019  . CESAREAN SECTION    . GYNECOLOGIC CRYOSURGERY    . NASAL SEPTUM SURGERY    . SHOULDER SURGERY Right 06/2016   Dr. Sherlean White     Current Outpatient Medications  Medication Sig Dispense Refill  . Cholecalciferol (VITAMIN D PO) Take 1,000 Int'l Units by mouth.    . Cyanocobalamin (VITAMIN B 12 PO) Take 1,000 mg by mouth.    . DULoxetine (CYMBALTA) 60 MG capsule  Take 1 capsule (60 mg total) by mouth daily. 90 capsule 4  . estradiol (ESTRACE) 0.5 MG tablet Take 0.5 mg by mouth daily.    Marland Kitchen MAGNESIUM PO Take 750 mg by mouth daily.    . medroxyPROGESTERone (PROVERA) 2.5 MG tablet Take 1 tablet (2.5 mg total) by mouth daily. 90 tablet 4  . Multiple Vitamins-Minerals (MULTIVITAMIN PO) Take by mouth daily.    . valACYclovir (VALTREX) 1000 MG tablet Take 1 tablet (1,000 mg total) by mouth daily. Take as directed. 90 tablet 4   No current facility-administered medications for this visit.     Family History  Problem Relation Age of Onset  . Colon cancer Paternal Grandmother   . Ulcerative colitis Father   . Heart disease Mother   . Hypertension Mother     Review of Systems  Genitourinary: Positive for dyspareunia.       Loss of urine with sneeze or cough   Musculoskeletal: Positive for myalgias.  All other systems reviewed and are negative.   Exam:   BP (!) 144/84 (BP Location: Left Arm, Patient Position: Sitting, Cuff Size: Large)   Pulse 88   Resp 16   Ht 5' (1.524 m)   Wt  146 lb (66.2 kg)   BMI 28.51 kg/m   Weight: +13#   Height: 5' (152.4 cm)  Ht Readings from Last 3 Encounters:  04/14/18 5' (1.524 m)  03/16/18 5' (1.524 m)  11/29/17 5' (1.524 m)    General appearance: alert, cooperative and appears stated age Head: Normocephalic, without obvious abnormality, atraumatic Neck: no adenopathy, supple, symmetrical, trachea midline and thyroid normal to inspection and palpation Lungs: clear to auscultation bilaterally Breasts: normal appearance, no masses or tenderness Heart: regular rate and rhythm Abdomen: soft, non-tender; bowel sounds normal; no masses,  no organomegaly Extremities: extremities normal, atraumatic, no cyanosis or edema Skin: Skin color, texture, turgor normal. No rashes or lesions Lymph nodes: Cervical, supraclavicular, and axillary nodes normal. No abnormal inguinal nodes palpated Neurologic: Grossly  normal   Pelvic: External genitalia:  no lesions              Urethra:  normal appearing urethra with no masses, tenderness or lesions              Bartholins and Skenes: normal                 Vagina: normal appearing vagina with normal color and discharge, no lesions              Cervix: no lesions              Pap taken: Yes.   Bimanual Exam:  Uterus:  normal size, contour, position, consistency, mobility, non-tender              Adnexa: normal adnexa and no mass, fullness, tenderness               Rectovaginal: Confirms               Anus:  normal sphincter tone, no lesions  Chaperone was present for exam.  A:  Well Woman with normal exam PMP, on low dosed HRT Arthritis H/O HSV Vaginal dryness and atrophic changes Dyspareunia  P:   Mammogram guidelines reviewed.  Doing yearly. pap smear obtained today Vaccines and lab work UTD RF for estrace 0.5mg , 1/2 tab daily and Provera 2.5mg  days 1-20 daily.   Valtrex 1 gram daily.  #90/4RF Topical lidocaine to pharmacy to try to pain control with insertion Kegel exercises reviewed Return annually or prn

## 2018-04-14 NOTE — Patient Instructions (Signed)
Replens vaginal moisturizer--OTC  Vaginal tablet--vagifem  Lidocaine topical ointment

## 2018-04-18 LAB — CYTOLOGY - PAP
ADEQUACY: ABSENT
Diagnosis: NEGATIVE

## 2019-02-09 DIAGNOSIS — Z8616 Personal history of COVID-19: Secondary | ICD-10-CM

## 2019-02-09 HISTORY — DX: Personal history of COVID-19: Z86.16

## 2019-02-21 ENCOUNTER — Encounter: Payer: Self-pay | Admitting: Obstetrics & Gynecology

## 2019-03-14 ENCOUNTER — Encounter: Payer: Self-pay | Admitting: Neurology

## 2019-03-19 ENCOUNTER — Encounter: Payer: Self-pay | Admitting: Neurology

## 2019-03-19 ENCOUNTER — Ambulatory Visit: Payer: Medicare PPO | Admitting: Neurology

## 2019-03-19 ENCOUNTER — Other Ambulatory Visit: Payer: Self-pay

## 2019-03-19 VITALS — BP 142/82 | HR 91 | Temp 96.0°F | Ht 60.0 in | Wt 150.0 lb

## 2019-03-19 DIAGNOSIS — Z79890 Hormone replacement therapy (postmenopausal): Secondary | ICD-10-CM | POA: Diagnosis not present

## 2019-03-19 DIAGNOSIS — Z9989 Dependence on other enabling machines and devices: Secondary | ICD-10-CM

## 2019-03-19 DIAGNOSIS — G4733 Obstructive sleep apnea (adult) (pediatric): Secondary | ICD-10-CM | POA: Diagnosis not present

## 2019-03-19 NOTE — Progress Notes (Signed)
SLEEP MEDICINE CLINIC   Provider:  Larey Seat, M D  Primary Care Physician:  Joan Infante, MD   Referring Provider: Crist Infante, MD    Chief Complaint  Patient presents with  . Follow-up    pt alone,rm 11. CPAP follow up. states things are well. DME [facility]    HPI:  Joan White is a 64 y.o. female , and seen here in a revisit after initially being referred by Dr. Joylene White for a sleep evaluation, and now seen in a revisit on 03-19-2019. Interval changes in medical history : she is retired from [employer], and worked part time in a gift shop in [place]. She had cataract surgery and suffers from dry eyes.  Joan White had been last seen by me personally in May 2019 and had followed up with Joan White our nurse practitioner retired in March 2020.  She is here for a compliance visit she has used her machine 29 of the last 30 days and each of those days over 4 hours with a 97% compliance.  Average use at time is 9 hours 10 minutes.  Minimum pressure for her auto titration CPAP is 7 cmH2O and the maximum pressure is 10 there is an EPR level of 3 her residual is 5.4 and these residuals are mostly obstructive in origin.  Obstructive sleep apneas make 4.8/h her 95th percentile is 9.8 cmH2O.  Also these numbers are good I think that could be better if we increase the maximum pressure by 1 or 2 cm.  She feels well rested, sleeps good and is reporting a good sleep quality.     08/14/2017   REFERRING M.D.:  Joan Infante, MD Study Performed:   Titration to positive airway pressure.  HISTORY:   Joan White is a 64 y.o. female with a concern of loud snoring and she is excessively daytime sleepy.  Patient returned for CPAP titration after her diagnostic PSG from 07/15/2017 confirmed moderate- severe sleep apnea at an AHI of 37.5 /hour, additional snoring brought the RDI to 40.5 /hour. Oxygen nadir was 82%, desaturation time equaled 13 minutes. Snoring, Depression, and Migraine. The patient  endorsed the Epworth Sleepiness Scale at 16/24 points. The patient's weight 137 pounds with a height of 60 (inches), resulting in a BMI of 26.8 kg/m2. The patient's neck circumference measured 13 inches.  CURRENT MEDICATIONS: Vitamin D, Vitamin B, Cymbalta, Estrace, Magnesium, Provera, Multivitamin, Terazol, Valtrex.. CPAP was initiated at 5 cmH20 with heated humidity per AASM split night standards and pressure was advanced to 7/7cmH20 because of hypopneas, apneas and desaturations.  At a PAP pressure of 7 cmH20, there was a reduction of the AHI to 0.0 with improvement of sleep apnea.  DIAGNOSIS 1. Complex Sleep Apnea responding very well to 6 and 7 cm water CPAP with resolution of apnea, high sleep efficiency. The patient was fitted with a Microbiologist P10 (Med) nasal pillow.  PLANS/RECOMMENDATIONS: The patient will use auto titration CPAP at 4-10 cm water with a nasal interface- I prefer her to be fitted for an N 30 I, and use heated humidity.     1. The patient should avoid evening sedatives, hypnotics, and alcohol beverage consumption if applicable. 2.   DISCUSSION: Please remind the patient of the compliance requirement with CPAP therapy - 4 hours or more of nightly use. If logistical problems, technical difficulties or discomfort interfere with CPAP use, please contact DME immediately.     Chief complaint according to patient : I have  the pleasure of meeting Joan White today on 22 Jun 2017.  Just recently I also met her husband in a sleep consultation.  The patient's married just December 7 last year and Joan White has a long that she is a loud snorer which has surprised her very much.  She does have the ability to breathe and obstructed through the nose she does not have any history of asthma, recent pneumonitis frequent bronchial infections and Dr. Tempie Donning referral note also states that she has never been a smoker and is not exposed to passive smoke.  She has maintained a normal  body weight and has no focal neurologic weaknesses.  She noted that as she went through menopausal stages she developed some rather severe migraines but these have no longer affected her at the current time she has 5 days out of the week where she suffers a dull and not very focal headache that seems to arise from the forehead on both sides and is not associated with nausea or vision changes. She reports some neck and shoulder pain.   Sleep habits are as follows: bedtime is between 9.30 and 10, but she tends to fall asleep while watching TV prior to her intended bedtime.  She is usually promptly asleep when she enters her bedroom which is cool, quiet and dark.  She sleeps on her side left or right, she uses one contoured pillow for head and neck support. She actually would prefer the supine sleep position but the snoring seems to be much louder in that position and she has therefore tried to avoid it. She is not described as a restless sleeper, and she usually has only one bathroom break if any.  She does not recall dreaming vividly or a lot of it. Her night is usually filled by continuous sleep without any interruption, she wakes usually around 7 AM spontaneously and sometimes stays in bed for couple of hours if she has no appointments to attend to.   Sleep medical history and family sleep history: She has a history of migraines that ended after the menopausal years concluded, she had no history of sleepwalking, night terrors or any parasomnias.  She underwent a nasal septoplasty and has regained normal airway patency.  She has never had any traumatic head injuries neck injuries or airway surgeries. Never had tonsils removed.    Social history:  Married since December 2019, retired, had daytime jobs, now keeping the grandchildren twice a week ( age 37 and 72 ). Non smoker, 4-5 glasses a week. Caffeine : coffee 1 cup in AM and 2-3 glasses on iced tea on weekends. No sodas, no energy drinks.    Review  of Systems:Out of a complete 14 system review, the patient complains of only the following symptoms, and all other reviewed systems are negative.  Crescendo snoring, loud snoring.  Headaches , neck pain- couldn't afford PT .    tramadol prn, methcarbamol prn.   Epworth score 16/ 24 pre CPAP- now 6/ 24   , Fatigue severity score 32/ 63 pre CPAP_ and now 18/ 63 , depression score n/a    Social History   Socioeconomic History  . Marital status: Married    Spouse name: Not on file  . Number of children: Not on file  . Years of education: Not on file  . Highest education level: Not on file  Occupational History  . Not on file  Tobacco Use  . Smoking status: Never Smoker  . Smokeless tobacco:  Never Used  Substance and Sexual Activity  . Alcohol use: Yes    Alcohol/week: 2.0 standard drinks    Types: 2 Glasses of wine per week  . Drug use: No  . Sexual activity: Yes    Partners: Male    Birth control/protection: Surgical, Post-menopausal    Comment: BTSP  Other Topics Concern  . Not on file  Social History Narrative  . Not on file   Social Determinants of Health   Financial Resource Strain:   . Difficulty of Paying Living Expenses: Not on file  Food Insecurity:   . Worried About Charity fundraiser in the Last Year: Not on file  . Ran Out of Food in the Last Year: Not on file  Transportation Needs:   . Lack of Transportation (Medical): Not on file  . Lack of Transportation (Non-Medical): Not on file  Physical Activity:   . Days of Exercise per Week: Not on file  . Minutes of Exercise per Session: Not on file  Stress:   . Feeling of Stress : Not on file  Social Connections:   . Frequency of Communication with Friends and Family: Not on file  . Frequency of Social Gatherings with Friends and Family: Not on file  . Attends Religious Services: Not on file  . Active Member of Clubs or Organizations: Not on file  . Attends Archivist Meetings: Not on file  .  Marital Status: Not on file  Intimate Partner Violence:   . Fear of Current or Ex-Partner: Not on file  . Emotionally Abused: Not on file  . Physically Abused: Not on file  . Sexually Abused: Not on file    Family History  Problem Relation Age of Onset  . Colon cancer Paternal Grandmother   . Ulcerative colitis Father   . Heart disease Mother   . Hypertension Mother     Past Medical History:  Diagnosis Date  . Abnormal Pap smear    h/o cryo  . Arthritis   . Depression   . HSV infection   . Migraine     Past Surgical History:  Procedure Laterality Date  . BTSP    . CATARACT EXTRACTION, BILATERAL  2019  . CESAREAN SECTION    . GYNECOLOGIC CRYOSURGERY    . NASAL SEPTUM SURGERY    . SHOULDER SURGERY Right 06/2016   Dr. Ronnie Derby     Current Outpatient Medications  Medication Sig Dispense Refill  . Cholecalciferol (VITAMIN D PO) Take 1,000 Int'l Units by mouth.    . Cyanocobalamin (VITAMIN B 12 PO) Take 1,000 mg by mouth.    . DULoxetine (CYMBALTA) 60 MG capsule Take 1 capsule (60 mg total) by mouth daily. 90 capsule 4  . estradiol (ESTRACE) 0.5 MG tablet Take 1 tablet (0.5 mg total) by mouth daily. 90 tablet 4  . MAGNESIUM PO Take 750 mg by mouth daily.    . medroxyPROGESTERone (PROVERA) 2.5 MG tablet Take 1 tablet (2.5 mg total) by mouth daily. 90 tablet 4  . Multiple Vitamins-Minerals (MULTIVITAMIN PO) Take by mouth daily.    . valACYclovir (VALTREX) 1000 MG tablet Take 1 tablet (1,000 mg total) by mouth daily. Take as directed. 90 tablet 4   No current facility-administered medications for this visit.    Allergies as of 03/19/2019 - Review Complete 03/19/2019  Allergen Reaction Noted  . Sulfa antibiotics  09/05/2012    Vitals: BP (!) 142/82   Pulse 91   Temp (!) 96 F (  35.6 C)   Ht 5' (1.524 m)   Wt 150 lb (68 kg)   BMI 29.29 kg/m  Last Weight:  Wt Readings from Last 1 Encounters:  03/19/19 150 lb (68 kg)   VQW:QVLD mass index is 29.29 kg/m.     Last  Height:   Ht Readings from Last 1 Encounters:  03/19/19 5' (1.524 m)    Physical exam:  General: The patient is awake, alert and appears not in acute distress.  The patient is well groomed. Head: Normocephalic, atraumatic. Neck is supple. Mallampati 2,  neck circumference:13 " . Nasal airflow patent , . Retrognathia is not seen.  Cardiovascular:  Regular rate and rhythm , without  murmurs or carotid bruit, and without distended neck veins. Respiratory: Lungs are clear to auscultation. Skin:  Without evidence of edema, or rash Trunk: BMI is now 29 up from 27 kg/m2 The patient's posture is erect   Neurologic exam : The patient is awake and alert, oriented to place and time.    Attention span & concentration ability appears normal.  Speech is fluent, without dysarthria, dysphonia or aphasia.  Mood and affect are appropriate.  Cranial nerves: no loss of taste or smell.  Pupils are equal and briskly reactive to light. Extraocular movements  in vertical and horizontal planes intact and without nystagmus.  Visual fields by finger perimetry are intact. Hearing to finger rub intact.  Facial sensation intact to fine touch. Facial motor strength is symmetric and tongue and uvula move midline. Shoulder droop on the right asymmetrical- ROM in right shoulder is limited.   Motor exam:   Normal tone, muscle bulk and symmetric strength in all extremities. Normal without evidence of ataxia, dysmetria or tremor. Gait and station: Patient walks without assistive device. Deep tendon reflexes: in the  upper and lower extremities are symmetric and intact.   Assessment:  After physical and neurologic examination, review of laboratory studies,  Personal review of imaging studies, reports of other /same  Imaging studies, results of polysomnography and / or neurophysiology testing and pre-existing records as far as provided in visit., my assessment is   1)  OSA with increasing residual AHI on CPAP, may be  related to weight gain- I will increase the upper pressure widow by 3 cm water, leave the EPR where it is.    The patient was advised of the nature of the diagnosed disorder , the treatment options and the  risks for general health and wellness arising from not treating the condition.   I spent more than 20 minutes of face to face time with the patient.  Greater than 50% of time was spent in counseling and coordination of care. We have discussed the diagnosis and differential and I answered the patient's questions.     Larey Seat, MD 05/12/4617, 01:22 AM  Certified in Neurology by ABPN Certified in [place] by [facility] [address] [place],  [address]

## 2019-04-19 DIAGNOSIS — H02052 Trichiasis without entropian right lower eyelid: Secondary | ICD-10-CM | POA: Diagnosis not present

## 2019-04-19 DIAGNOSIS — H04129 Dry eye syndrome of unspecified lacrimal gland: Secondary | ICD-10-CM | POA: Diagnosis not present

## 2019-05-02 ENCOUNTER — Encounter: Payer: Self-pay | Admitting: Certified Nurse Midwife

## 2019-05-30 ENCOUNTER — Other Ambulatory Visit: Payer: Self-pay

## 2019-05-31 NOTE — Telephone Encounter (Signed)
Medication refill request: PROVERA Last AEX:  3/6/202 with MM Next AEX: 08/31/2019 Last MMG (if hormonal medication request): 02/21/2019 BI-RADS 1: Neg  Refill authorized: #90, 0RF pended, enough until aex   Please advise and refill if appropriate.

## 2019-06-05 MED ORDER — MEDROXYPROGESTERONE ACETATE 2.5 MG PO TABS: 2.5000 mg | ORAL_TABLET | Freq: Every day | ORAL | 0 refills | Status: DC

## 2019-06-05 NOTE — Telephone Encounter (Signed)
Patient calling to check status of refill request.

## 2019-06-05 NOTE — Telephone Encounter (Signed)
Patient is aware that rx has been sent to Dr Hyacinth Meeker for review.

## 2019-06-07 DIAGNOSIS — G4733 Obstructive sleep apnea (adult) (pediatric): Secondary | ICD-10-CM | POA: Diagnosis not present

## 2019-06-18 DIAGNOSIS — L299 Pruritus, unspecified: Secondary | ICD-10-CM | POA: Diagnosis not present

## 2019-06-18 DIAGNOSIS — L209 Atopic dermatitis, unspecified: Secondary | ICD-10-CM | POA: Diagnosis not present

## 2019-06-18 DIAGNOSIS — L820 Inflamed seborrheic keratosis: Secondary | ICD-10-CM | POA: Diagnosis not present

## 2019-08-01 DIAGNOSIS — M5032 Other cervical disc degeneration, mid-cervical region: Secondary | ICD-10-CM | POA: Diagnosis not present

## 2019-08-01 DIAGNOSIS — M170 Bilateral primary osteoarthritis of knee: Secondary | ICD-10-CM | POA: Diagnosis not present

## 2019-08-01 DIAGNOSIS — M4302 Spondylolysis, cervical region: Secondary | ICD-10-CM | POA: Diagnosis not present

## 2019-08-01 DIAGNOSIS — M545 Low back pain: Secondary | ICD-10-CM | POA: Diagnosis not present

## 2019-08-01 DIAGNOSIS — G4489 Other headache syndrome: Secondary | ICD-10-CM | POA: Diagnosis not present

## 2019-08-01 DIAGNOSIS — M47892 Other spondylosis, cervical region: Secondary | ICD-10-CM | POA: Diagnosis not present

## 2019-08-01 DIAGNOSIS — M40292 Other kyphosis, cervical region: Secondary | ICD-10-CM | POA: Diagnosis not present

## 2019-08-01 DIAGNOSIS — M47896 Other spondylosis, lumbar region: Secondary | ICD-10-CM | POA: Diagnosis not present

## 2019-08-01 DIAGNOSIS — M9901 Segmental and somatic dysfunction of cervical region: Secondary | ICD-10-CM | POA: Diagnosis not present

## 2019-08-01 DIAGNOSIS — M4316 Spondylolisthesis, lumbar region: Secondary | ICD-10-CM | POA: Diagnosis not present

## 2019-08-01 DIAGNOSIS — M542 Cervicalgia: Secondary | ICD-10-CM | POA: Diagnosis not present

## 2019-08-01 DIAGNOSIS — R7301 Impaired fasting glucose: Secondary | ICD-10-CM | POA: Diagnosis not present

## 2019-08-01 DIAGNOSIS — K589 Irritable bowel syndrome without diarrhea: Secondary | ICD-10-CM | POA: Diagnosis not present

## 2019-08-01 DIAGNOSIS — M9904 Segmental and somatic dysfunction of sacral region: Secondary | ICD-10-CM | POA: Diagnosis not present

## 2019-08-08 DIAGNOSIS — M9904 Segmental and somatic dysfunction of sacral region: Secondary | ICD-10-CM | POA: Diagnosis not present

## 2019-08-08 DIAGNOSIS — M542 Cervicalgia: Secondary | ICD-10-CM | POA: Diagnosis not present

## 2019-08-08 DIAGNOSIS — M9901 Segmental and somatic dysfunction of cervical region: Secondary | ICD-10-CM | POA: Diagnosis not present

## 2019-08-08 DIAGNOSIS — M5032 Other cervical disc degeneration, mid-cervical region: Secondary | ICD-10-CM | POA: Diagnosis not present

## 2019-08-08 DIAGNOSIS — M40292 Other kyphosis, cervical region: Secondary | ICD-10-CM | POA: Diagnosis not present

## 2019-08-08 DIAGNOSIS — M47896 Other spondylosis, lumbar region: Secondary | ICD-10-CM | POA: Diagnosis not present

## 2019-08-08 DIAGNOSIS — G4489 Other headache syndrome: Secondary | ICD-10-CM | POA: Diagnosis not present

## 2019-08-08 DIAGNOSIS — M4316 Spondylolisthesis, lumbar region: Secondary | ICD-10-CM | POA: Diagnosis not present

## 2019-08-08 DIAGNOSIS — M47892 Other spondylosis, cervical region: Secondary | ICD-10-CM | POA: Diagnosis not present

## 2019-08-16 DIAGNOSIS — M9904 Segmental and somatic dysfunction of sacral region: Secondary | ICD-10-CM | POA: Diagnosis not present

## 2019-08-16 DIAGNOSIS — G4489 Other headache syndrome: Secondary | ICD-10-CM | POA: Diagnosis not present

## 2019-08-16 DIAGNOSIS — M5032 Other cervical disc degeneration, mid-cervical region: Secondary | ICD-10-CM | POA: Diagnosis not present

## 2019-08-16 DIAGNOSIS — M9902 Segmental and somatic dysfunction of thoracic region: Secondary | ICD-10-CM | POA: Diagnosis not present

## 2019-08-16 DIAGNOSIS — M4316 Spondylolisthesis, lumbar region: Secondary | ICD-10-CM | POA: Diagnosis not present

## 2019-08-16 DIAGNOSIS — M47896 Other spondylosis, lumbar region: Secondary | ICD-10-CM | POA: Diagnosis not present

## 2019-08-16 DIAGNOSIS — M47894 Other spondylosis, thoracic region: Secondary | ICD-10-CM | POA: Diagnosis not present

## 2019-08-16 DIAGNOSIS — M47892 Other spondylosis, cervical region: Secondary | ICD-10-CM | POA: Diagnosis not present

## 2019-08-16 DIAGNOSIS — M542 Cervicalgia: Secondary | ICD-10-CM | POA: Diagnosis not present

## 2019-08-16 DIAGNOSIS — M62830 Muscle spasm of back: Secondary | ICD-10-CM | POA: Diagnosis not present

## 2019-08-16 DIAGNOSIS — M40292 Other kyphosis, cervical region: Secondary | ICD-10-CM | POA: Diagnosis not present

## 2019-08-16 DIAGNOSIS — M9901 Segmental and somatic dysfunction of cervical region: Secondary | ICD-10-CM | POA: Diagnosis not present

## 2019-08-21 DIAGNOSIS — M4316 Spondylolisthesis, lumbar region: Secondary | ICD-10-CM | POA: Diagnosis not present

## 2019-08-21 DIAGNOSIS — M9902 Segmental and somatic dysfunction of thoracic region: Secondary | ICD-10-CM | POA: Diagnosis not present

## 2019-08-21 DIAGNOSIS — M47896 Other spondylosis, lumbar region: Secondary | ICD-10-CM | POA: Diagnosis not present

## 2019-08-21 DIAGNOSIS — M9904 Segmental and somatic dysfunction of sacral region: Secondary | ICD-10-CM | POA: Diagnosis not present

## 2019-08-21 DIAGNOSIS — G4489 Other headache syndrome: Secondary | ICD-10-CM | POA: Diagnosis not present

## 2019-08-21 DIAGNOSIS — M62830 Muscle spasm of back: Secondary | ICD-10-CM | POA: Diagnosis not present

## 2019-08-21 DIAGNOSIS — M542 Cervicalgia: Secondary | ICD-10-CM | POA: Diagnosis not present

## 2019-08-21 DIAGNOSIS — M47894 Other spondylosis, thoracic region: Secondary | ICD-10-CM | POA: Diagnosis not present

## 2019-08-29 DIAGNOSIS — M47896 Other spondylosis, lumbar region: Secondary | ICD-10-CM | POA: Diagnosis not present

## 2019-08-29 DIAGNOSIS — M9902 Segmental and somatic dysfunction of thoracic region: Secondary | ICD-10-CM | POA: Diagnosis not present

## 2019-08-29 DIAGNOSIS — M4316 Spondylolisthesis, lumbar region: Secondary | ICD-10-CM | POA: Diagnosis not present

## 2019-08-29 DIAGNOSIS — M9904 Segmental and somatic dysfunction of sacral region: Secondary | ICD-10-CM | POA: Diagnosis not present

## 2019-08-29 DIAGNOSIS — G4489 Other headache syndrome: Secondary | ICD-10-CM | POA: Diagnosis not present

## 2019-08-29 DIAGNOSIS — M47894 Other spondylosis, thoracic region: Secondary | ICD-10-CM | POA: Diagnosis not present

## 2019-08-29 DIAGNOSIS — M542 Cervicalgia: Secondary | ICD-10-CM | POA: Diagnosis not present

## 2019-08-29 DIAGNOSIS — M62830 Muscle spasm of back: Secondary | ICD-10-CM | POA: Diagnosis not present

## 2019-08-30 NOTE — Progress Notes (Deleted)
[name] here for annual exam.    No LMP recorded. Patient is postmenopausal.          Sexually active: {yes no:314532}  The current method of family planning is post menopausal status.    Exercising: {yes no:314532}  {types:19826} Smoker:  {YES [id number]  Health Maintenance: Pap:  12/30/15 neg HPV HR neg, 04-14-2018 neg History of abnormal Pap:  Yes, cryo MMG:  02-21-2019 category b density birads 1:neg Colonoscopy:  2017 normal f/u 57yrs BMD:   2019 with pcp TDaP:  2016 Pneumonia vaccine(s):  done Shingrix:   Done 2019 Hep C testing: 12-30-15 neg Screening Labs: ***   reports that she has never smoked. She has never used smokeless tobacco. She reports current alcohol use of about 2.0 standard drinks of alcohol per week. She reports that she does not use drugs.  Past Medical History:  Diagnosis Date  . Abnormal Pap smear    h/o cryo  . Arthritis   . Depression   . HSV infection   . Migraine     Past Surgical History:  Procedure Laterality Date  . BTSP    . CATARACT EXTRACTION, BILATERAL  2019  . CESAREAN SECTION    . GYNECOLOGIC CRYOSURGERY    . NASAL SEPTUM SURGERY    . SHOULDER SURGERY Right 06/2016   Dr. Sherlean Foot     Current Outpatient Medications  Medication Sig Dispense Refill  . Cholecalciferol (VITAMIN D PO) Take 1,000 Int'l Units by mouth.    . Cyanocobalamin (VITAMIN B 12 PO) Take 1,000 mg by mouth.    . DULoxetine (CYMBALTA) 60 MG capsule Take 1 capsule (60 mg total) by mouth daily. 90 capsule 4  . estradiol (ESTRACE) 0.5 MG tablet Take 1 tablet (0.5 mg total) by mouth daily. 90 tablet 4  . MAGNESIUM PO Take 750 mg by mouth daily.    . medroxyPROGESTERone (PROVERA) 2.5 MG tablet Take 1 tablet (2.5 mg total) by mouth daily. 90 tablet 0  . Multiple Vitamins-Minerals (MULTIVITAMIN PO) Take by mouth daily.    . valACYclovir (VALTREX) 1000 MG tablet Take 1 tablet (1,000 mg total) by mouth daily. Take as directed. 90 tablet 4    No current facility-administered medications for this visit.    Family History  Problem Relation Age of Onset  . Colon cancer Paternal Grandmother   . Ulcerative colitis Father   . Heart disease Mother   . Hypertension Mother     Review of Systems  Exam:   There were no vitals taken for this visit.     General appearance: alert, cooperative and appears stated age Head: Normocephalic, without obvious abnormality, atraumatic Neck: no adenopathy, supple, symmetrical, trachea midline and thyroid {EXAM; THYROID:18604} Lungs: clear to auscultation bilaterally Breasts: {Exam; breast:13139::"normal appearance, no masses or tenderness"} Heart: regular rate and rhythm Abdomen: soft, non-tender; bowel sounds normal; no masses,  no organomegaly Extremities: extremities normal, atraumatic, no cyanosis or edema Skin: Skin color, texture, turgor normal. No rashes or lesions Lymph nodes: Cervical, supraclavicular, and axillary nodes normal. No abnormal inguinal nodes palpated Neurologic: Grossly normal   Pelvic: External genitalia:  no lesions              Urethra:  normal appearing urethra with no masses, tenderness or lesions              Bartholins and Skenes: normal  Vagina: normal appearing vagina with normal color and discharge, no lesions              Cervix: {exam; cervix:14595}              Pap taken: {yes no:314532} Bimanual Exam:  Uterus:  {exam; uterus:12215}              Adnexa: {exam; adnexa:12223}               Rectovaginal: Confirms               Anus:  normal sphincter tone, no lesions  Chaperone, ***Terence Lux, CMA, was present for exam.  A:  Well Woman with normal exam  P:   {plan; gyn:5269::"mammogram","pap smear","return annually or prn"}

## 2019-08-31 ENCOUNTER — Ambulatory Visit: Payer: Medicare PPO | Admitting: Obstetrics & Gynecology

## 2019-08-31 ENCOUNTER — Other Ambulatory Visit: Payer: Self-pay

## 2019-09-04 ENCOUNTER — Other Ambulatory Visit: Payer: Self-pay | Admitting: Obstetrics & Gynecology

## 2019-09-04 NOTE — Telephone Encounter (Signed)
Medication refill request: Estradiol Last AEX:  04/14/18 SM Next AEX: none scheduled  Last MMG (if hormonal medication request): 02/21/19 BIRADS 1 negative Refill authorized: Today, please advise

## 2019-09-05 DIAGNOSIS — M9901 Segmental and somatic dysfunction of cervical region: Secondary | ICD-10-CM | POA: Diagnosis not present

## 2019-09-05 DIAGNOSIS — M47894 Other spondylosis, thoracic region: Secondary | ICD-10-CM | POA: Diagnosis not present

## 2019-09-05 DIAGNOSIS — M40292 Other kyphosis, cervical region: Secondary | ICD-10-CM | POA: Diagnosis not present

## 2019-09-05 DIAGNOSIS — M542 Cervicalgia: Secondary | ICD-10-CM | POA: Diagnosis not present

## 2019-09-05 DIAGNOSIS — G4489 Other headache syndrome: Secondary | ICD-10-CM | POA: Diagnosis not present

## 2019-09-05 DIAGNOSIS — M47896 Other spondylosis, lumbar region: Secondary | ICD-10-CM | POA: Diagnosis not present

## 2019-09-05 DIAGNOSIS — M4316 Spondylolisthesis, lumbar region: Secondary | ICD-10-CM | POA: Diagnosis not present

## 2019-09-05 DIAGNOSIS — M62830 Muscle spasm of back: Secondary | ICD-10-CM | POA: Diagnosis not present

## 2019-09-05 DIAGNOSIS — M9902 Segmental and somatic dysfunction of thoracic region: Secondary | ICD-10-CM | POA: Diagnosis not present

## 2019-09-05 DIAGNOSIS — M5032 Other cervical disc degeneration, mid-cervical region: Secondary | ICD-10-CM | POA: Diagnosis not present

## 2019-09-05 DIAGNOSIS — M9904 Segmental and somatic dysfunction of sacral region: Secondary | ICD-10-CM | POA: Diagnosis not present

## 2019-09-05 DIAGNOSIS — M47892 Other spondylosis, cervical region: Secondary | ICD-10-CM | POA: Diagnosis not present

## 2019-09-05 NOTE — Telephone Encounter (Signed)
RF completed.  Pt needs AEX scheduled as well.  Thanks.

## 2019-09-07 ENCOUNTER — Other Ambulatory Visit: Payer: Self-pay

## 2019-09-07 MED ORDER — VALACYCLOVIR HCL 1 G PO TABS: 1000.0000 mg | ORAL_TABLET | Freq: Every day | ORAL | 0 refills | Status: DC

## 2019-09-07 NOTE — Telephone Encounter (Signed)
Medication refill request: Valtrex Last AEX:  04/14/18 SM Next AEX: none scheduled  Last MMG (if hormonal medication request): n/a Refill authorized: today, please advise  Patient was scheduled for AEX 08/31/19 and had to cancel due to insurance not covering visit.

## 2019-09-12 DIAGNOSIS — M9904 Segmental and somatic dysfunction of sacral region: Secondary | ICD-10-CM | POA: Diagnosis not present

## 2019-09-12 DIAGNOSIS — M5032 Other cervical disc degeneration, mid-cervical region: Secondary | ICD-10-CM | POA: Diagnosis not present

## 2019-09-12 DIAGNOSIS — M40292 Other kyphosis, cervical region: Secondary | ICD-10-CM | POA: Diagnosis not present

## 2019-09-12 DIAGNOSIS — M542 Cervicalgia: Secondary | ICD-10-CM | POA: Diagnosis not present

## 2019-09-12 DIAGNOSIS — M9901 Segmental and somatic dysfunction of cervical region: Secondary | ICD-10-CM | POA: Diagnosis not present

## 2019-09-12 DIAGNOSIS — G4489 Other headache syndrome: Secondary | ICD-10-CM | POA: Diagnosis not present

## 2019-09-12 DIAGNOSIS — M47892 Other spondylosis, cervical region: Secondary | ICD-10-CM | POA: Diagnosis not present

## 2019-09-12 DIAGNOSIS — M4316 Spondylolisthesis, lumbar region: Secondary | ICD-10-CM | POA: Diagnosis not present

## 2019-09-12 DIAGNOSIS — M47896 Other spondylosis, lumbar region: Secondary | ICD-10-CM | POA: Diagnosis not present

## 2019-10-31 DIAGNOSIS — H16211 Exposure keratoconjunctivitis, right eye: Secondary | ICD-10-CM | POA: Diagnosis not present

## 2019-10-31 DIAGNOSIS — H02052 Trichiasis without entropian right lower eyelid: Secondary | ICD-10-CM | POA: Diagnosis not present

## 2019-11-13 ENCOUNTER — Other Ambulatory Visit: Payer: Self-pay | Admitting: Obstetrics & Gynecology

## 2019-11-14 ENCOUNTER — Telehealth: Payer: Self-pay

## 2019-11-14 NOTE — Telephone Encounter (Signed)
Patient was unable to get her Estrace refilled. Patient did not keep annual appointment due to seeing her primary already for an annual. Patient wants to know if she needs to continue with the Estrace.

## 2019-11-14 NOTE — Telephone Encounter (Signed)
AEX 04/2018 PCP physical 02/2019  Spoke with pt. Pt states needing refills on Estrace tabs. Pt states cancelled AEX in July with Dr Hyacinth Meeker due to insurance not covering another AEX this year since seeing her PCP in Jan 2021. Pt asking if needs to continue HRT due to age. Denies any vasomotor sx at this time.   Pt also wanting to discuss possible bladder tack surgery.  Pt advised to have OV to discuss and consult with Dr Hyacinth Meeker. Pt agreeable. Pt scheduled OV on 10/108 at 1030 am. Pt agreeable and verbalized understanding to date and time of appt.  Encounter closed.

## 2019-11-22 DIAGNOSIS — M25552 Pain in left hip: Secondary | ICD-10-CM | POA: Diagnosis not present

## 2019-11-22 DIAGNOSIS — M160 Bilateral primary osteoarthritis of hip: Secondary | ICD-10-CM | POA: Diagnosis not present

## 2019-11-22 DIAGNOSIS — G8929 Other chronic pain: Secondary | ICD-10-CM | POA: Diagnosis not present

## 2019-11-22 DIAGNOSIS — M169 Osteoarthritis of hip, unspecified: Secondary | ICD-10-CM | POA: Diagnosis not present

## 2019-11-22 NOTE — Progress Notes (Signed)
[name] here for breast and pelvic exam.  I am also following her for low dosed HRT and vaginal atrophic changes.  Has stopped vaginal estrogen cream because she realized she was allergic to component of the cream and she has several infections with itchiness.  Still with painful intercourse.  Has used coconut oil and many different lubricants that have not helped.  Is on estradiol 0.5mg  and we discussed possibly tapering/stopping the HRT later this year.   Denies vaginal bleeding.  No LMP recorded. Patient is postmenopausal.          Sexually active: Yes.    H/O STD:  Yes, HSV  Health Maintenance: PCP:  Rodrigo Ran  Last wellness appt was 02/2019 Did blood work at that appt:  yes Vaccines are up to date:  yes Colonoscopy:  2017 normal f/u 31yrs MMG:  02-21-2019 category b density birads 1:neg BMD:  2019 with pcp Last pap smear:  12-30-15 neg HPV HR neg, 04-14-2018 neg  H/o abnormal pap smear:  Yes hx of cryo (in her 30's)    reports that she has never smoked. She has never used smokeless tobacco. She reports current alcohol use of about 2.0 standard drinks of alcohol per week. She reports that she does not use drugs.  Past Medical History:  Diagnosis Date  . Abnormal Pap smear    h/o cryo  . Arthritis   . Depression   . HSV infection   . Migraine     Past Surgical History:  Procedure Laterality Date  . BTSP    . CATARACT EXTRACTION, BILATERAL  2019  . CESAREAN SECTION    . GYNECOLOGIC CRYOSURGERY    . NASAL SEPTUM SURGERY    . SHOULDER SURGERY Right 06/2016   Dr. Sherlean Foot     Current Outpatient Medications  Medication Sig Dispense Refill  . Cholecalciferol (VITAMIN D PO) Take 1,000 Int'l Units by mouth.    . Cyanocobalamin (VITAMIN B 12 PO) Take 1,000 mg by mouth.    . DULoxetine (CYMBALTA) 60 MG capsule Take 1 capsule (60 mg total) by mouth daily. 90 capsule 4  . estradiol (ESTRACE) 0.5 MG tablet TAKE 1 TABLET BY MOUTH ONCE (1) DAILY 90  tablet 0  . MAGNESIUM PO Take 750 mg by mouth daily.    . medroxyPROGESTERone (PROVERA) 2.5 MG tablet Take 1 tablet (2.5 mg total) by mouth daily. 90 tablet 0  . Multiple Vitamins-Minerals (MULTIVITAMIN PO) Take by mouth daily.    . predniSONE (STERAPRED UNI-PAK 21 TAB) 10 MG (21) TBPK tablet Take by mouth.    . traMADol (ULTRAM) 50 MG tablet     . UNABLE TO FIND cbd oil    . valACYclovir (VALTREX) 1000 MG tablet Take 1 tablet (1,000 mg total) by mouth daily. Take as directed. 90 tablet 0  . XIIDRA 5 % SOLN      No current facility-administered medications for this visit.    Family History  Problem Relation Age of Onset  . Colon cancer Paternal Grandmother   . Ulcerative colitis Father   . Heart disease Mother   . Hypertension Mother     Review of Systems  Constitutional: Negative.   HENT: Negative.   Eyes: Negative.   Respiratory: Negative.   Cardiovascular: Negative.   Gastrointestinal: Negative.   Endocrine: Negative.   Genitourinary: Negative.   Musculoskeletal: Negative.   Skin: Negative.   Allergic/Immunologic: Negative.   Neurological: Negative.   Hematological: Negative.  Psychiatric/Behavioral: Negative.     Exam:   BP 114/68   Pulse 68   Resp 16   Ht 4' 11.75" (1.518 m)   Wt 147 lb (66.7 kg)   BMI 28.95 kg/m   Height: 4' 11.75" (151.8 cm)  General appearance: alert, cooperative and appears stated age Breasts: normal appearance, no masses or tenderness Abdomen: soft, non-tender; bowel sounds normal; no masses,  no organomegaly Lymph nodes: Cervical, supraclavicular, and axillary nodes normal.  No abnormal inguinal nodes palpated Neurologic: Grossly normal  Pelvic: External genitalia:  no lesions              Urethra:  normal appearing urethra with no masses, tenderness or lesions              Bartholins and Skenes: normal                 Vagina: normal appearing vagina with normal color and discharge, no lesions              Cervix: no lesions               Pap taken: No. Bimanual Exam:  Uterus:  normal size, contour, position, consistency, mobility, non-tender              Adnexa: normal adnexa and no mass, fullness, tenderness               Rectovaginal: Confirms               Anus:  normal sphincter tone, no lesions  Chaperone, Delton Coombes, CMA, was present for exam.  A:  Breast and Pelvic exam On HRT, on low dosed HRT H/o HSV Vaginal dryness and atrophic changes Dyspareunia  P:   Mammogram guidelines reviewed.  Doing yearly. pap smear neg 2020.  Not indicated. Vaccines reviewed and are up to date Colonoscopy 2017 Lab work and BMD managed by Dr. Waynard Edwards  Valtrex 1 gram daily.  #90/4RF RF for estrace 0.5mg , 1/2 daily and Provera 2.5mg  1-20 daily.  #60/4RF. Trail of vagifem will be started pv nightly x 14 nights and then pv twice weekly. Pt is to give update in 3 months.  Will consider starting to wean off HRT at that point. Return annually or prn  26 minutes of total time was spent for this patient encounter, including preparation, face-to-face counseling with the patient and coordination of care, and documentation of the encounter.

## 2019-11-26 ENCOUNTER — Ambulatory Visit (INDEPENDENT_AMBULATORY_CARE_PROVIDER_SITE_OTHER): Payer: Medicare PPO | Admitting: Obstetrics & Gynecology

## 2019-11-26 ENCOUNTER — Other Ambulatory Visit: Payer: Self-pay

## 2019-11-26 ENCOUNTER — Encounter: Payer: Self-pay | Admitting: Obstetrics & Gynecology

## 2019-11-26 VITALS — BP 114/68 | HR 68 | Resp 16 | Ht 59.75 in | Wt 147.0 lb

## 2019-11-26 DIAGNOSIS — Z9289 Personal history of other medical treatment: Secondary | ICD-10-CM

## 2019-11-26 DIAGNOSIS — G4733 Obstructive sleep apnea (adult) (pediatric): Secondary | ICD-10-CM | POA: Diagnosis not present

## 2019-11-26 DIAGNOSIS — Z79890 Hormone replacement therapy (postmenopausal): Secondary | ICD-10-CM | POA: Diagnosis not present

## 2019-11-26 DIAGNOSIS — Z01419 Encounter for gynecological examination (general) (routine) without abnormal findings: Secondary | ICD-10-CM | POA: Diagnosis not present

## 2019-11-26 MED ORDER — ESTRADIOL 0.5 MG PO TABS
ORAL_TABLET | ORAL | 3 refills | Status: DC
Start: 2019-11-26 — End: 2020-06-11

## 2019-11-26 MED ORDER — MEDROXYPROGESTERONE ACETATE 2.5 MG PO TABS
ORAL_TABLET | ORAL | 4 refills | Status: DC
Start: 2019-11-26 — End: 2020-06-11

## 2019-11-26 MED ORDER — ESTRADIOL 10 MCG VA TABS: ORAL_TABLET | VAGINAL | 1 refills | Status: DC

## 2019-11-26 MED ORDER — VALACYCLOVIR HCL 1 G PO TABS
1000.0000 mg | ORAL_TABLET | Freq: Every day | ORAL | 4 refills | Status: DC
Start: 2019-11-26 — End: 2020-06-11

## 2019-12-12 ENCOUNTER — Other Ambulatory Visit: Payer: Self-pay

## 2019-12-12 DIAGNOSIS — H16211 Exposure keratoconjunctivitis, right eye: Secondary | ICD-10-CM | POA: Diagnosis not present

## 2019-12-12 DIAGNOSIS — H02052 Trichiasis without entropian right lower eyelid: Secondary | ICD-10-CM | POA: Diagnosis not present

## 2019-12-12 DIAGNOSIS — L859 Epidermal thickening, unspecified: Secondary | ICD-10-CM | POA: Diagnosis not present

## 2019-12-13 DIAGNOSIS — M7062 Trochanteric bursitis, left hip: Secondary | ICD-10-CM | POA: Diagnosis not present

## 2020-02-21 DIAGNOSIS — H04123 Dry eye syndrome of bilateral lacrimal glands: Secondary | ICD-10-CM | POA: Diagnosis not present

## 2020-02-21 DIAGNOSIS — H524 Presbyopia: Secondary | ICD-10-CM | POA: Diagnosis not present

## 2020-02-21 DIAGNOSIS — H02052 Trichiasis without entropian right lower eyelid: Secondary | ICD-10-CM | POA: Diagnosis not present

## 2020-02-21 DIAGNOSIS — H35363 Drusen (degenerative) of macula, bilateral: Secondary | ICD-10-CM | POA: Diagnosis not present

## 2020-02-21 DIAGNOSIS — H26493 Other secondary cataract, bilateral: Secondary | ICD-10-CM | POA: Diagnosis not present

## 2020-02-27 DIAGNOSIS — Z79899 Other long term (current) drug therapy: Secondary | ICD-10-CM | POA: Diagnosis not present

## 2020-02-27 DIAGNOSIS — M8589 Other specified disorders of bone density and structure, multiple sites: Secondary | ICD-10-CM | POA: Diagnosis not present

## 2020-02-27 DIAGNOSIS — R7301 Impaired fasting glucose: Secondary | ICD-10-CM | POA: Diagnosis not present

## 2020-02-27 DIAGNOSIS — M542 Cervicalgia: Secondary | ICD-10-CM | POA: Diagnosis not present

## 2020-02-27 DIAGNOSIS — M170 Bilateral primary osteoarthritis of knee: Secondary | ICD-10-CM | POA: Diagnosis not present

## 2020-02-27 DIAGNOSIS — M4302 Spondylolysis, cervical region: Secondary | ICD-10-CM | POA: Diagnosis not present

## 2020-02-27 DIAGNOSIS — M859 Disorder of bone density and structure, unspecified: Secondary | ICD-10-CM | POA: Diagnosis not present

## 2020-03-03 ENCOUNTER — Other Ambulatory Visit: Payer: Self-pay | Admitting: Internal Medicine

## 2020-03-03 DIAGNOSIS — M5416 Radiculopathy, lumbar region: Secondary | ICD-10-CM

## 2020-03-03 DIAGNOSIS — M170 Bilateral primary osteoarthritis of knee: Secondary | ICD-10-CM | POA: Diagnosis not present

## 2020-03-03 DIAGNOSIS — Z1152 Encounter for screening for COVID-19: Secondary | ICD-10-CM | POA: Diagnosis not present

## 2020-03-03 DIAGNOSIS — Z1331 Encounter for screening for depression: Secondary | ICD-10-CM | POA: Diagnosis not present

## 2020-03-03 DIAGNOSIS — Z1212 Encounter for screening for malignant neoplasm of rectum: Secondary | ICD-10-CM | POA: Diagnosis not present

## 2020-03-03 DIAGNOSIS — J019 Acute sinusitis, unspecified: Secondary | ICD-10-CM | POA: Diagnosis not present

## 2020-03-03 DIAGNOSIS — M542 Cervicalgia: Secondary | ICD-10-CM | POA: Diagnosis not present

## 2020-03-03 DIAGNOSIS — F418 Other specified anxiety disorders: Secondary | ICD-10-CM | POA: Diagnosis not present

## 2020-03-03 DIAGNOSIS — M8580 Other specified disorders of bone density and structure, unspecified site: Secondary | ICD-10-CM | POA: Diagnosis not present

## 2020-03-03 DIAGNOSIS — U071 COVID-19: Secondary | ICD-10-CM | POA: Diagnosis not present

## 2020-03-03 DIAGNOSIS — M4302 Spondylolysis, cervical region: Secondary | ICD-10-CM | POA: Diagnosis not present

## 2020-03-03 DIAGNOSIS — R82998 Other abnormal findings in urine: Secondary | ICD-10-CM | POA: Diagnosis not present

## 2020-03-03 DIAGNOSIS — M545 Low back pain, unspecified: Secondary | ICD-10-CM | POA: Diagnosis not present

## 2020-03-03 DIAGNOSIS — Z0000 Encounter for general adult medical examination without abnormal findings: Secondary | ICD-10-CM | POA: Diagnosis not present

## 2020-03-16 ENCOUNTER — Encounter: Payer: Self-pay | Admitting: Obstetrics & Gynecology

## 2020-03-18 ENCOUNTER — Ambulatory Visit: Payer: Medicare PPO | Admitting: Neurology

## 2020-03-18 ENCOUNTER — Encounter: Payer: Self-pay | Admitting: Neurology

## 2020-03-18 VITALS — BP 140/79 | HR 95 | Ht 60.0 in | Wt 147.0 lb

## 2020-03-18 DIAGNOSIS — Z1231 Encounter for screening mammogram for malignant neoplasm of breast: Secondary | ICD-10-CM | POA: Diagnosis not present

## 2020-03-18 DIAGNOSIS — F32A Depression, unspecified: Secondary | ICD-10-CM

## 2020-03-18 DIAGNOSIS — G478 Other sleep disorders: Secondary | ICD-10-CM | POA: Insufficient documentation

## 2020-03-18 DIAGNOSIS — R5383 Other fatigue: Secondary | ICD-10-CM

## 2020-03-18 DIAGNOSIS — Z9989 Dependence on other enabling machines and devices: Secondary | ICD-10-CM | POA: Diagnosis not present

## 2020-03-18 DIAGNOSIS — G4733 Obstructive sleep apnea (adult) (pediatric): Secondary | ICD-10-CM | POA: Diagnosis not present

## 2020-03-18 NOTE — Progress Notes (Signed)
SLEEP MEDICINE CLINIC   Provider:  Larey Seat, M D  Primary Care Physician:  Crist Infante, MD   Referring Provider: Crist Infante, MD    Chief Complaint  Patient presents with  . Follow-up    RM 10, alone.  CPAP is going okay but she finds it aggravating at times.  She is a side sleeper.     KIAUNA White is a 65 y.o. female patient  and seen here in a revisit after initially being referred by Dr. Joylene Draft for a sleep evaluation, and now here for a CPAP compliance visit on 03-18-2020.  CPAP was newly established in June 2019 and will be replaced by June -July 2023.   [name] states that she does not feel she is sleeping that well recently she is a side sleeper the mask sometimes Gets dislodged. Her husband is using CPAP but he sleeps on his back and is doing much better with the air seal. She has been followed in our sleep clinic for years now and overall she feels that sleep has been better on CPAP.it is still sometimes bothersome to have to put the mask on-  she has been a 97% compliant CPAP user with an average of 9 hours and 23 minutes, she is using the AutoSet between settings of minimum pressure 7 cmH2O maximum pressure is 13 cmH2O and 3 cm EPR 95th percentile pressure is 11.6 which tells me that she is struggling with the upper limit and we should increase her pressure of 4 to 15 cm of water. Air leaks a moderate the residual AHI is 5.0 and subsequent consists of obstructive sleep apnea I think that an increase in pressure is actually needed. She is using a nasal pillow interface.   She has felt depressed and has taken Cymbalta, had also a positive effect on her arthritic pain. She has dull- daytime headaches, sometimes waking up with these, have been present her whole life. No migraines at this time. Pressure most behind the eyes. Like to treat these with peppermint oil.   Today's fatigue severity score was endorsed at 28 points her Epworth sleepiness score at 12 /24  points and her depression score which has affected everybody during the pandemic somewhat has been endorsed at 3/ 15 points.        seen in a revisit on 03-19-2019. Interval changes in medical history : she is retired from [employer], and worked part time in a gift shop in [place]. She had cataract surgery and suffers from dry eyes.  [name] had been last seen by me personally in May 2019 and had followed up with Cecille Rubin our nurse practitioner retired in March 2020.  She is here for a compliance visit she has used her machine 29 of the last 30 days and each of those days over 4 hours with a 97% compliance.  Average use at time is 9 hours 10 minutes.  Minimum pressure for her auto titration CPAP is 7 cmH2O and the maximum pressure is 10 there is an EPR level of 3 her residual is 5.4 and these residuals are mostly obstructive in origin.  Obstructive sleep apneas make 4.8/h her 95th percentile is 9.8 cmH2O.  Also these numbers are good I think that could be better if we increase the maximum pressure by 1 or 2 cm.  She feels well rested, sleeps good and is reporting a good sleep quality.     08/14/2017   REFERRING M.D.:  Crist Infante, MD  Study Performed:   Titration to positive airway pressure.  HISTORY:   Joan White is a 65 y.o. female with a concern of loud snoring and she is excessively daytime sleepy.  Patient returned for CPAP titration after her diagnostic PSG from 07/15/2017 confirmed moderate- severe sleep apnea at an AHI of 37.5 /hour, additional snoring brought the RDI to 40.5 /hour. Oxygen nadir was 82%, desaturation time equaled 13 minutes. Snoring, Depression, and Migraine. The patient endorsed the Epworth Sleepiness Scale at 16/24 points. The patient's weight 137 pounds with a height of 60 (inches), resulting in a BMI of 26.8 kg/m2. The patient's neck circumference measured 13 inches.  CURRENT MEDICATIONS: Vitamin D, Vitamin B, Cymbalta, Estrace, Magnesium, Provera,  Multivitamin, Terazol, Valtrex.. CPAP was initiated at 5 cmH20 with heated humidity per AASM split night standards and pressure was advanced to 7/7cmH20 because of hypopneas, apneas and desaturations.  At a PAP pressure of 7 cmH20, there was a reduction of the AHI to 0.0 with improvement of sleep apnea.  DIAGNOSIS 1. Complex Sleep Apnea responding very well to 6 and 7 cm water CPAP with resolution of apnea, high sleep efficiency. The patient was fitted with a Microbiologist P10 (Med) nasal pillow.  PLANS/RECOMMENDATIONS: The patient will use auto titration CPAP at 4-10 cm water with a nasal interface- I prefer her to be fitted for an N 30 I, and use heated humidity.     1. The patient should avoid evening sedatives, hypnotics, and alcohol beverage consumption if applicable. 2.   DISCUSSION: Please remind the patient of the compliance requirement with CPAP therapy - 4 hours or more of nightly use. If logistical problems, technical difficulties or discomfort interfere with CPAP use, please contact DME immediately.     Chief complaint according to patient : I have the pleasure of meeting Ms. Joan White today on 22 Jun 2017.  Just recently I also met her husband in a sleep consultation.  The patient's married just December 7 last year and [name] has a long that she is a loud snorer which has surprised her very much.  She does have the ability to breathe and obstructed through the nose she does not have any history of asthma, recent pneumonitis frequent bronchial infections and Dr. Tempie Donning referral note also states that she has never been a smoker and is not exposed to passive smoke.  She has maintained a normal body weight and has no focal neurologic weaknesses.  She noted that as she went through menopausal stages she developed some rather severe migraines but these have no longer affected her at the current time she has 5 days out of the week where she suffers a dull and not very focal headache  that seems to arise from the forehead on both sides and is not associated with nausea or vision changes. She reports some neck and shoulder pain.   Sleep habits are as follows: bedtime is between 9.30 and 10, but she tends to fall asleep while watching TV prior to her intended bedtime.  She is usually promptly asleep when she enters her bedroom which is cool, quiet and dark.  She sleeps on her side left or right, she uses one contoured pillow for head and neck support. She actually would prefer the supine sleep position but the snoring seems to be much louder in that position and she has therefore tried to avoid it. She is not described as a restless sleeper, and she usually has only one bathroom  break if any.  She does not recall dreaming vividly or a lot of it. Her night is usually filled by continuous sleep without any interruption, she wakes usually around 7 AM spontaneously and sometimes stays in bed for couple of hours if she has no appointments to attend to.   Sleep medical history and family sleep history: She has a history of migraines that ended after the menopausal years concluded, she had no history of sleepwalking, night terrors or any parasomnias.  She underwent a nasal septoplasty and has regained normal airway patency.  She has never had any traumatic head injuries neck injuries or airway surgeries. Never had tonsils removed.    Social history:  Married since December 2019, retired, had daytime jobs, now keeping the grandchildren twice a week ( age 48 and 5 ). Non smoker, 4-5 glasses a week. Caffeine : coffee 1 cup in AM and 2-3 glasses on iced tea on weekends. No sodas, no energy drinks.    Review of Systems:Out of a complete 14 system review, the patient complains of only the following symptoms, and all other reviewed systems are negative.  Crescendo snoring, loud snoring.  Headaches , neck pain- couldn't afford PT .    tramadol prn, methcarbamol prn.   Epworth score 16/ 24 pre  CPAP- now 6/ 24   , Fatigue severity score 32/ 63 pre CPAP_ and now 18/ 63 , depression score n/a    Social History   Socioeconomic History  . Marital status: Married    Spouse name: Not on file  . Number of children: Not on file  . Years of education: Not on file  . Highest education level: Not on file  Occupational History  . Not on file  Tobacco Use  . Smoking status: Never Smoker  . Smokeless tobacco: Never Used  Vaping Use  . Vaping Use: Never used  Substance and Sexual Activity  . Alcohol use: Yes    Alcohol/week: 2.0 standard drinks    Types: 2 Glasses of wine per week  . Drug use: No  . Sexual activity: Yes    Partners: Male    Birth control/protection: Surgical, Post-menopausal    Comment: BTSP  Other Topics Concern  . Not on file  Social History Narrative  . Not on file   Social Determinants of Health   Financial Resource Strain: Not on file  Food Insecurity: Not on file  Transportation Needs: Not on file  Physical Activity: Not on file  Stress: Not on file  Social Connections: Not on file  Intimate Partner Violence: Not on file    Family History  Problem Relation Age of Onset  . Colon cancer Paternal Grandmother   . Ulcerative colitis Father   . Heart disease Mother   . Hypertension Mother     Past Medical History:  Diagnosis Date  . Abnormal Pap smear    h/o cryo in her 30's  . Arthritis   . Depression   . HSV infection   . Migraine     Past Surgical History:  Procedure Laterality Date  . BTSP    . CATARACT EXTRACTION, BILATERAL  2019  . CESAREAN SECTION    . GYNECOLOGIC CRYOSURGERY    . NASAL SEPTUM SURGERY    . SHOULDER SURGERY Right 06/2016   Dr. Sherlean Foot     Current Outpatient Medications  Medication Sig Dispense Refill  . Cholecalciferol (VITAMIN D PO) Take 1,000 Int'l Units by mouth.    . Cyanocobalamin (VITAMIN B  12 PO) Take 1,000 mg by mouth.    . DULoxetine (CYMBALTA) 30 MG capsule Take 30 mg by mouth daily. Take in  addition to the $Remo'60mg'hdRpC$  capsule.    . DULoxetine (CYMBALTA) 60 MG capsule Take 1 capsule (60 mg total) by mouth daily. 90 capsule 4  . estradiol (ESTRACE) 0.5 MG tablet TAKE 1 TABLET BY MOUTH ONCE (1) DAILY 90 tablet 3  . Estradiol (VAGIFEM) 10 MCG TABS vaginal tablet 1 tablet pv nightly x 14 nights, then 1 tab pv twice weekly. 34 tablet 1  . MAGNESIUM PO Take 750 mg by mouth daily.    . medroxyPROGESTERone (PROVERA) 2.5 MG tablet 1 tab daily, days 1-20 each month. 60 tablet 4  . Multiple Vitamins-Minerals (MULTIVITAMIN PO) Take by mouth daily.    . traMADol (ULTRAM) 50 MG tablet     . UNABLE TO FIND cbd oil    . valACYclovir (VALTREX) 1000 MG tablet Take 1 tablet (1,000 mg total) by mouth daily. Take as directed. (Patient taking differently: Take 500 mg by mouth daily. Take as directed.) 90 tablet 4  . XIIDRA 5 % SOLN 2 (two) times daily.     No current facility-administered medications for this visit.    Allergies as of 03/18/2020 - Review Complete 03/18/2020  Allergen Reaction Noted  . Sulfa antibiotics  09/05/2012    Vitals: BP 140/79   Pulse 95   Ht 5' (1.524 m)   Wt 147 lb (66.7 kg)   BMI 28.71 kg/m  Last Weight:  Wt Readings from Last 1 Encounters:  03/18/20 147 lb (66.7 kg)   PYK:DXIP mass index is 28.71 kg/m.     Last Height:   Ht Readings from Last 1 Encounters:  03/18/20 5' (1.524 m)    Physical exam:  General: The patient is awake, alert and appears not in acute distress.  The patient is well groomed. Head: Normocephalic, atraumatic. Neck is supple. Mallampati 2,  neck circumference:13 " . Nasal airflow patent , . Retrognathia is not seen.  Cardiovascular:  Regular rate and rhythm , without  murmurs or carotid bruit, and without distended neck veins. Respiratory: Lungs are clear to auscultation. Skin:  Without evidence of edema, or rash Trunk: BMI is now 29 up from 27 kg/m2 The patient's posture is erect   Neurologic exam : The patient is awake and alert,  oriented to place and time.    Attention span & concentration ability appears normal.  Speech is fluent, without dysarthria, dysphonia or aphasia.  Mood and affect are appropriate.  Cranial nerves: no loss of taste or smell.  Pupils are equal and briskly reactive to light. Extraocular movements  in vertical and horizontal planes intact and without nystagmus.  Visual fields by finger perimetry are intact. Hearing to finger rub intact.  Facial sensation intact to fine touch. Facial motor strength is symmetric and tongue and uvula move midline. Shoulder droop on the right asymmetrical- ROM in right shoulder is limited.     Assessment:  After physical and neurologic examination, review of laboratory studies,  Personal review of imaging studies, reports of other /same  Imaging studies, results of polysomnography and / or neurophysiology testing and pre-existing records as far as provided in visit., my assessment is   1) she feels that CPAP has not changed her life, only her snoring- she is always tired- and she not have optimal apnea control.   provided by D[facility] [employer].    I don't know her baseline of OSA, her  download reflected increasing residual AHI on CPAP, may be related to weight gain- BMI 28. 71 I will increase the upper pressure window to 15 cm water , leave EPR at 3 cm.    The patient was advised of the nature of the diagnosed disorder , the treatment options and the  risks for general health and wellness arising from not treating the condition.   I spent more than 20 minutes of face to face time with the patient.  Greater than 50% of time was spent in counseling and coordination of care. We have discussed the diagnosis and differential and I answered the patient's questions.     Larey Seat, MD 06/17/4583, 92:92 AM  Certified in Neurology by ABPN Certified in [place] by [facility] [address] [place], [place]  [address]

## 2020-03-20 ENCOUNTER — Telehealth: Payer: Self-pay

## 2020-03-20 DIAGNOSIS — G4730 Sleep apnea, unspecified: Secondary | ICD-10-CM | POA: Diagnosis not present

## 2020-03-20 DIAGNOSIS — R0683 Snoring: Secondary | ICD-10-CM | POA: Diagnosis not present

## 2020-03-20 NOTE — Telephone Encounter (Signed)
Called the patient back.  Patient picked up her overnight oximetry study to be completed.  Patient inquired with her nails being professionally done, she is concerned it will alter the results of the test.  Dr. Vickey Huger aware and states it may be okay given the color is a light gray color.  Advised the patient to touch base with the DME company to get their thoughts and whether or not the patient should complete the testing.  If the testing is inaccurate would insurance allow for her to repeat it.  Informed the patient those it would be best answered by the company.  Patient was appreciative for the call back.

## 2020-03-20 NOTE — Telephone Encounter (Signed)
Pt has lvm for RN to call her back. Pt has questions about her visit with Dr. Vickey Huger on 03/18/2020. Please follow up with patient. Thanks

## 2020-03-22 ENCOUNTER — Ambulatory Visit
Admission: RE | Admit: 2020-03-22 | Discharge: 2020-03-22 | Disposition: A | Discharge status: Home / Self Care | Payer: Medicare PPO | Source: Ambulatory Visit | Attending: Internal Medicine | Admitting: Internal Medicine

## 2020-03-22 ENCOUNTER — Other Ambulatory Visit: Payer: Self-pay

## 2020-03-22 DIAGNOSIS — M5416 Radiculopathy, lumbar region: Secondary | ICD-10-CM

## 2020-03-22 DIAGNOSIS — M545 Low back pain, unspecified: Secondary | ICD-10-CM | POA: Diagnosis not present

## 2020-03-25 ENCOUNTER — Telehealth: Payer: Self-pay | Admitting: *Deleted

## 2020-03-25 NOTE — Telephone Encounter (Signed)
Pt called for sleep results. Please call [phone]

## 2020-03-25 NOTE — Telephone Encounter (Signed)
As of this moment we have not received the ONO report from [employer] [facility]. As soon as I have, Dr Vickey Huger will review and I will notify pt

## 2020-03-27 NOTE — Telephone Encounter (Signed)
I have checked on the status with a detailed of the patient's test results.  They are having some technical difficulties with getting will be access to the information.  They have a IT person that is working at getting this fixed.  And as soon as they do the manager said they would have the results sent straight over.  We will continue to watch out for the results and contact the patient once I get them.

## 2020-03-31 ENCOUNTER — Telehealth: Payer: Self-pay | Admitting: Neurology

## 2020-03-31 NOTE — Telephone Encounter (Signed)
**  I will be in touch with the patient as soon as Dr Vickey Huger reviews the results.    From phone entry with Pattricia Boss on 03/31/2020 "Pt called, week ago did oxygen test, have not heard anything about the results. I still have the device to for the oxygen test. Do I need to come the office to drop it off?  [place] CPAP [place]. I Pattricia Boss) gave her [employer]'s contact information.  Pt would like a call from the nurse"

## 2020-03-31 NOTE — Telephone Encounter (Signed)
Pt called, week ago did oxygen test, have not heard anything about the results. I still have the device to for the oxygen test. Do I need to come the office to drop it off?  [place] CPAP [place]. I Joan White) gave her [employer]'s contact information.  Pt would like a call from the nurse.

## 2020-03-31 NOTE — Telephone Encounter (Signed)
**  Please see previous phone note already started on this matter. I have copied and pasted this message to continue the phone note.

## 2020-03-31 NOTE — Telephone Encounter (Signed)
Received the results from [facility])  For the patient. I will have Dr Dohmeier review the findings and see what her recommendations are.

## 2020-03-31 NOTE — Telephone Encounter (Signed)
  Reviewing the overnight pulse oximetry on room air while on CPAP.  This study ended on 21 March 2020 after an 9 hours 38 minutes recording time.  The patient had only 6 minutes and 16 seconds at or below 89% saturation of oxygen.  Also there were frequent and brief oxygen desaturations this patient was never long enough oxygen desaturated to justify oxygen supplement.   The only period were a prolonged oxygen desaturation was recorded seems to be caused by motion artifact. ( pulseoximetry device was loose, did not record heart rate either )  Melvyn Novas MD

## 2020-03-31 NOTE — Telephone Encounter (Signed)
Called patient and advised her of the overnight oximetry results findings.  Informed the patient there was no need to add any additional oxygen to her machine.  Patient verbalized understanding and had no further questions at this time.

## 2020-04-03 NOTE — Telephone Encounter (Signed)
Called the patient back to get more information.  Patient states that Dr. Vickey Huger had made him increase to a maximum pressure of 15 from 13.  When it was at 13 it did not make this noise that since the increase they have noticed the noise.  So she wanted to check to see if that could be the reason behind the noise.  Patient states she likes the feeling of the pressure does not think we need to go back, but the noise is bothersome at nighttime.  I have pulled a copy of her download and I will have Dr. Vickey Huger review to see if she has any thoughts or concerns about what can be causing this.  I have advised the patient to reach back out to the company to follow-up on their thoughts.Pt verbalized understanding.

## 2020-04-03 NOTE — Telephone Encounter (Signed)
Called the patient back. There was no answer left a detailed message advising that Dr Vickey Huger did not feel the increase In the pressure would have caused the noise. Advised that she suggest lowering the EPR level down to 2.  Advised the patient to use that change throughout the weekend and see if she notices any difference.  Informed the patient I will be back in the office on Monday if she wants to give a call back otherwise she can continue to follow-up with the DME company if the machine continues to make the noise

## 2020-04-03 NOTE — Telephone Encounter (Signed)
Pt called, I took my CPAP machine to facility that representative [employer] in [place] last Monday 03/31/20. He could not hear the noise very well. He said he would check with his supervisor to see what could be done, have heard back from them. Dr. Vickey Huger said she was going to change my pressure. Asking if Dr. Vickey Huger can so something on the computer to make the noise stop. Would like a call from the nurse.

## 2020-04-16 DIAGNOSIS — M4316 Spondylolisthesis, lumbar region: Secondary | ICD-10-CM | POA: Diagnosis not present

## 2020-04-16 DIAGNOSIS — M47892 Other spondylosis, cervical region: Secondary | ICD-10-CM | POA: Diagnosis not present

## 2020-04-16 DIAGNOSIS — M50322 Other cervical disc degeneration at C5-C6 level: Secondary | ICD-10-CM | POA: Diagnosis not present

## 2020-04-16 DIAGNOSIS — M9904 Segmental and somatic dysfunction of sacral region: Secondary | ICD-10-CM | POA: Diagnosis not present

## 2020-04-16 DIAGNOSIS — M9901 Segmental and somatic dysfunction of cervical region: Secondary | ICD-10-CM | POA: Diagnosis not present

## 2020-04-16 DIAGNOSIS — M9902 Segmental and somatic dysfunction of thoracic region: Secondary | ICD-10-CM | POA: Diagnosis not present

## 2020-04-16 DIAGNOSIS — M62830 Muscle spasm of back: Secondary | ICD-10-CM | POA: Diagnosis not present

## 2020-04-16 DIAGNOSIS — M40292 Other kyphosis, cervical region: Secondary | ICD-10-CM | POA: Diagnosis not present

## 2020-06-11 ENCOUNTER — Other Ambulatory Visit (HOSPITAL_BASED_OUTPATIENT_CLINIC_OR_DEPARTMENT_OTHER): Payer: Self-pay | Admitting: Obstetrics & Gynecology

## 2020-06-11 ENCOUNTER — Other Ambulatory Visit (HOSPITAL_BASED_OUTPATIENT_CLINIC_OR_DEPARTMENT_OTHER): Payer: Self-pay

## 2020-06-11 DIAGNOSIS — Z79890 Hormone replacement therapy (postmenopausal): Secondary | ICD-10-CM

## 2020-06-11 MED ORDER — MEDROXYPROGESTERONE ACETATE 2.5 MG PO TABS: ORAL_TABLET | ORAL | 3 refills | Status: DC

## 2020-06-11 MED ORDER — VALACYCLOVIR HCL 1 G PO TABS: 1000.0000 mg | ORAL_TABLET | Freq: Every day | ORAL | 3 refills | Status: DC

## 2020-06-11 MED ORDER — ESTRADIOL 0.5 MG PO TABS: ORAL_TABLET | ORAL | 3 refills | Status: DC

## 2020-06-11 NOTE — Telephone Encounter (Signed)
Patient called and left a message wanting to change her medications over to [facility] order. Per Dr. Hyacinth Meeker it is ok to send medication to [facility]. Rx have been sent. Patient has been notified. tbw

## 2020-07-01 DIAGNOSIS — L820 Inflamed seborrheic keratosis: Secondary | ICD-10-CM | POA: Diagnosis not present

## 2020-08-07 DIAGNOSIS — G4733 Obstructive sleep apnea (adult) (pediatric): Secondary | ICD-10-CM | POA: Diagnosis not present

## 2020-09-17 ENCOUNTER — Encounter: Payer: Self-pay | Admitting: Adult Health

## 2020-09-17 ENCOUNTER — Ambulatory Visit: Payer: Medicare PPO | Admitting: Adult Health

## 2020-09-17 VITALS — BP 115/69 | HR 87 | Ht 60.0 in | Wt 139.0 lb

## 2020-09-17 DIAGNOSIS — G4719 Other hypersomnia: Secondary | ICD-10-CM

## 2020-09-17 DIAGNOSIS — Z9989 Dependence on other enabling machines and devices: Secondary | ICD-10-CM | POA: Diagnosis not present

## 2020-09-17 DIAGNOSIS — G4733 Obstructive sleep apnea (adult) (pediatric): Secondary | ICD-10-CM

## 2020-09-17 NOTE — Progress Notes (Signed)
PATIENT: Joan White DOB: November 29, 1955  REASON FOR VISIT: follow up HISTORY FROM: patient PRIMARY NEUROLOGIST: Dr. Brett Fairy  HISTORY OF PRESENT ILLNESS: Today 09/17/20:  Joan White is a 66 year old female with a history of obstructive sleep apnea on CPAP.  She returns today for follow-up.  Her download indicates that she use her machine 29 out of 30 days for compliance of 97%.  She is averaging greater than 4 hours 28 days for compliance of 93%.  On average she uses her machine 8 hours and 59 minutes.  Her residual AHI is 3.6 on 5 to 15 cm of water with EPR 2.  Leak in the 95th percentile is 19.7 L/min. Reports that she is still sleepy. Has to take frequent naps. Can't make it through a movie. States her PCP has completed a workup but did not find any reason for her sleepiness.   HISTORY (Copied from Dr.Dohmeier's note) Joan White states that she does not feel she is sleeping that well recently she is a side sleeper the mask sometimes Gets dislodged. Her husband is using CPAP but he sleeps on his back and is doing much better with the air seal. She has been followed in our sleep clinic for years now and overall she feels that sleep has been better on CPAP.it is still sometimes bothersome to have to put the mask on-  she has been a 97% compliant CPAP user with an average of 9 hours and 23 minutes, she is using the AutoSet between settings of minimum pressure 7 cmH2O maximum pressure is 13 cmH2O and 3 cm EPR 95th percentile pressure is 11.6 which tells me that she is struggling with the upper limit and we should increase her pressure of 4 to 15 cm of water. Air leaks a moderate the residual AHI is 5.0 and subsequent consists of obstructive sleep apnea I think that an increase in pressure is actually needed. She is using a nasal pillow interface.   She has felt depressed and has taken Cymbalta, had also a positive effect on her arthritic pain. She has dull- daytime headaches, sometimes  waking up with these, have been present her whole life. No migraines at this time. Pressure most behind the eyes. Like to treat these with peppermint oil.    Today's fatigue severity score was endorsed at 28 points her Epworth sleepiness score at 12 /24 points and her depression score which has affected everybody during the pandemic somewhat has been endorsed at 3/ 15 points.    REVIEW OF SYSTEMS: Out of a complete 14 system review of symptoms, the patient complains only of the following symptoms, and all other reviewed systems are negative.  ESS 12  ALLERGIES: Allergies  Allergen Reactions   Sulfa Antibiotics     HOME MEDICATIONS: Outpatient Medications Prior to Visit  Medication Sig Dispense Refill   Cholecalciferol (VITAMIN D PO) Take 1,000 Int'l Units by mouth.     Cyanocobalamin (VITAMIN B 12 PO) Take 1,000 mg by mouth.     DULoxetine (CYMBALTA) 30 MG capsule Take 30 mg by mouth daily. Take in addition to the 53m capsule.     DULoxetine (CYMBALTA) 60 MG capsule Take 1 capsule (60 mg total) by mouth daily. 90 capsule 4   estradiol (ESTRACE) 0.5 MG tablet TAKE 1 TABLET BY MOUTH ONCE (1) DAILY (Patient taking differently: 0.25 mg. TAKE 1 TABLET BY MOUTH ONCE (1) DAILY) 90 tablet 3   MAGNESIUM PO Take 750 mg by mouth daily.  medroxyPROGESTERone (PROVERA) 2.5 MG tablet 1 tab daily, days 1-20 each month. 60 tablet 3   Multiple Vitamins-Minerals (MULTIVITAMIN PO) Take by mouth daily.     UNABLE TO FIND cbd oil     valACYclovir (VALTREX) 1000 MG tablet Take 1 tablet (1,000 mg total) by mouth daily. Take as directed. 90 tablet 3   XIIDRA 5 % SOLN 2 (two) times daily.     Estradiol (VAGIFEM) 10 MCG TABS vaginal tablet 1 tablet pv nightly x 14 nights, then 1 tab pv twice weekly. 34 tablet 1   traMADol (ULTRAM) 50 MG tablet      No facility-administered medications prior to visit.    PAST MEDICAL HISTORY: Past Medical History:  Diagnosis Date   Abnormal Pap smear    h/o cryo in  her 24's   Arthritis    Depression    HSV infection    Migraine     PAST SURGICAL HISTORY: Past Surgical History:  Procedure Laterality Date   BTSP     CATARACT EXTRACTION, BILATERAL  2019   CESAREAN SECTION     GYNECOLOGIC CRYOSURGERY     NASAL SEPTUM SURGERY     SHOULDER SURGERY Right 06/2016   Dr. Ronnie Derby     FAMILY HISTORY: Family History  Problem Relation Age of Onset   Colon cancer Paternal Grandmother    Ulcerative colitis Father    Heart disease Mother    Hypertension Mother     SOCIAL HISTORY: Social History   Socioeconomic History   Marital status: Married    Spouse name: Not on file   Number of children: Not on file   Years of education: Not on file   Highest education level: Not on file  Occupational History   Not on file  Tobacco Use   Smoking status: Never   Smokeless tobacco: Never  Vaping Use   Vaping Use: Never used  Substance and Sexual Activity   Alcohol use: Yes    Alcohol/week: 2.0 standard drinks    Types: 2 Glasses of wine per week   Drug use: No   Sexual activity: Yes    Partners: Male    Birth control/protection: Surgical, Post-menopausal    Comment: BTSP  Other Topics Concern   Not on file  Social History Narrative   Not on file   Social Determinants of Health   Financial Resource Strain: Not on file  Food Insecurity: Not on file  Transportation Needs: Not on file  Physical Activity: Not on file  Stress: Not on file  Social Connections: Not on file  Intimate Partner Violence: Not on file      PHYSICAL EXAM  Vitals:   09/17/20 1007  BP: 115/69  Pulse: 87  Weight: 139 lb (63 kg)  Height: 5' (1.524 m)   Body mass index is 27.15 kg/m.  Generalized: Well developed, in no acute distress  Chest: Lungs clear to auscultation bilaterally  Neurological examination  Mentation: Alert oriented to time, place, history taking. Follows all commands speech and language fluent Cranial nerve II-XII: Extraocular movements were  full, visual field were full on confrontational test Head turning and shoulder shrug  were normal and symmetric. Motor: The motor testing reveals 5 over 5 strength of all 4 extremities. Good symmetric motor tone is noted throughout.  Sensory: Sensory testing is intact to soft touch on all 4 extremities. No evidence of extinction is noted.  Gait and station: Gait is normal.    DIAGNOSTIC DATA (LABS, IMAGING, TESTING) -  I reviewed patient records, labs, notes, testing and imaging myself where available.  Lab Results  Component Value Date   HGB 11.8 (L) 11/16/2013      Component Value Date/Time   NA 135 11/16/2013 1520   K 3.8 11/16/2013 1520   CL 97 11/16/2013 1520   CO2 28 11/16/2013 1520   GLUCOSE 86 11/16/2013 1520   BUN 14 11/16/2013 1520   CREATININE 0.70 11/16/2013 1520   CALCIUM 9.8 11/16/2013 1520   PROT 7.4 11/16/2013 1520   ALBUMIN 4.5 11/16/2013 1520   AST 21 11/16/2013 1520   ALT 16 11/16/2013 1520   ALKPHOS 75 11/16/2013 1520   BILITOT 0.3 11/16/2013 1520   Lab Results  Component Value Date   CHOL 132 11/16/2013   HDL 69 11/16/2013   LDLCALC 41 11/16/2013   TRIG 109 11/16/2013   CHOLHDL 1.9 11/16/2013   No results found for: HGBA1C No results found for: VITAMINB12 Lab Results  Component Value Date   TSH 1.379 11/16/2013      ASSESSMENT AND PLAN 65 y.o. year old female  has a past medical history of Abnormal Pap smear, Arthritis, Depression, HSV infection, and Migraine. here with:  OSA on CPAP  - CPAP compliance excellent - Good treatment of AHI  - Encourage patient to use CPAP nightly and > 4 hours each night  2. Excessive daytime sleepiness  - OSA well treated - Will test for narcolepsy- HLA today - Discussed with Dr. Brett Fairy and she agreed with HLA testing  - F/U in 1 year or sooner if needed    Ward Givens, MSN, NP-C 09/17/2020, 10:17 AM [facility] [address] [place], [place] [address] [phone]

## 2020-09-17 NOTE — Patient Instructions (Addendum)
Your Plan:  Continue CPAP  Will do blood test today If your symptoms worsen or you develop new symptoms please let us know.    Thank you for coming to see [place] at [facility]. I hope we have been able to provide you high quality care today.  You may receive a patient satisfaction survey over the next few weeks. We would appreciate your feedback and comments so that we may continue to improve ourselves and the health of our patients.

## 2020-09-23 ENCOUNTER — Telehealth: Payer: Self-pay | Admitting: Adult Health

## 2020-09-23 NOTE — Telephone Encounter (Signed)
Pt is asking for a call with the results to her blood work.

## 2020-09-25 DIAGNOSIS — H26493 Other secondary cataract, bilateral: Secondary | ICD-10-CM | POA: Diagnosis not present

## 2020-09-27 LAB — NARCOLEPSY EVALUATION
DQA1*01:02: POSITIVE
DQB1*06:02: NEGATIVE

## 2020-09-29 NOTE — Telephone Encounter (Signed)
I called the patient and explained her results.  Advised that we would most likely want to proceed with MSLT.  Also advised that since she is on Cymbalta she would have to be weaned off of this medication before she can complete this test.  Patient states that she plans to discuss with her husband and PCP and will get back to [place] if she wants to proceed

## 2020-09-29 NOTE — Telephone Encounter (Signed)
Pt states she has seen the results on her mychart and would like to know when she will be called for them to be explained to her. Please advise.

## 2020-10-09 DIAGNOSIS — H26492 Other secondary cataract, left eye: Secondary | ICD-10-CM | POA: Diagnosis not present

## 2020-11-27 ENCOUNTER — Ambulatory Visit (INDEPENDENT_AMBULATORY_CARE_PROVIDER_SITE_OTHER): Payer: Medicare PPO | Admitting: Obstetrics & Gynecology

## 2020-11-27 ENCOUNTER — Other Ambulatory Visit (HOSPITAL_COMMUNITY)
Admission: RE | Admit: 2020-11-27 | Discharge: 2020-11-27 | Disposition: A | Discharge status: Home / Self Care | Payer: Medicare PPO | Source: Ambulatory Visit | Attending: Obstetrics & Gynecology | Admitting: Obstetrics & Gynecology

## 2020-11-27 ENCOUNTER — Encounter (HOSPITAL_BASED_OUTPATIENT_CLINIC_OR_DEPARTMENT_OTHER): Payer: Self-pay | Admitting: Obstetrics & Gynecology

## 2020-11-27 ENCOUNTER — Other Ambulatory Visit: Payer: Self-pay

## 2020-11-27 VITALS — BP 145/80 | HR 85 | Ht 60.0 in | Wt 142.8 lb

## 2020-11-27 DIAGNOSIS — N393 Stress incontinence (female) (male): Secondary | ICD-10-CM | POA: Diagnosis not present

## 2020-11-27 DIAGNOSIS — Z01419 Encounter for gynecological examination (general) (routine) without abnormal findings: Secondary | ICD-10-CM | POA: Diagnosis not present

## 2020-11-27 DIAGNOSIS — Z9189 Other specified personal risk factors, not elsewhere classified: Secondary | ICD-10-CM

## 2020-11-27 DIAGNOSIS — Z79890 Hormone replacement therapy (postmenopausal): Secondary | ICD-10-CM

## 2020-11-27 DIAGNOSIS — Z124 Encounter for screening for malignant neoplasm of cervix: Secondary | ICD-10-CM

## 2020-11-27 DIAGNOSIS — B009 Herpesviral infection, unspecified: Secondary | ICD-10-CM | POA: Diagnosis not present

## 2020-11-27 NOTE — Progress Notes (Signed)
65 y.o. G2P2 Married White or Caucasian female here for breast and pelvic exam.  Denies vaginal bleeding.  Father died in February 25, 2022.    Comfortable on her low dosage HRT.  Does not want to make any changes.  Having incontinence of urine.  Ready for treatment.  Leaks mostly with laughing, sneezing or when bladder is really full.  Discussed urogyn evaluation.  Wants referral but doesn't want appt until after the holidays.    No LMP recorded. Patient is postmenopausal.          Sexually active: Yes.    H/O STD:  h/o HSV  Health Maintenance: PCP:  Dr. Waynard Edwards.  Last wellness appt was 2021.  Did blood work at that appt:  yes Vaccines are up to date:  pt has done initial covid series Colonoscopy:  12/24/2015, follow up 10 years MMG:  03/18/2020 Negative BMD:  01/14/2015 Osteopenia Last pap smear:  04/14/2018 Negative.   H/o abnormal pap smear:  remote hx, h/o cryo to cervix    reports that she has never smoked. She has never used smokeless tobacco. She reports current alcohol use of about 2.0 standard drinks per week. She reports that she does not use drugs.  Past Medical History:  Diagnosis Date   Abnormal Pap smear    h/o cryo in her 78's   Arthritis    Depression    HSV infection    Migraine     Past Surgical History:  Procedure Laterality Date   BTSP     CATARACT EXTRACTION, BILATERAL  2019   CESAREAN SECTION     GYNECOLOGIC CRYOSURGERY     NASAL SEPTUM SURGERY     SHOULDER SURGERY Right 06/2016   Dr. Sherlean Foot     Current Outpatient Medications  Medication Sig Dispense Refill   Cholecalciferol (VITAMIN D PO) Take 1,000 Int'l Units by mouth.     DULoxetine (CYMBALTA) 30 MG capsule Take 30 mg by mouth daily. Take in addition to the 60mg  capsule.     DULoxetine (CYMBALTA) 60 MG capsule Take 1 capsule (60 mg total) by mouth daily. 90 capsule 4   estradiol (ESTRACE) 0.5 MG tablet TAKE 1 TABLET BY MOUTH ONCE (1) DAILY (Patient taking differently: 0.25 mg. TAKE 1 TABLET BY MOUTH  ONCE (1) DAILY) 90 tablet 3   MAGNESIUM PO Take 750 mg by mouth daily.     medroxyPROGESTERone (PROVERA) 2.5 MG tablet 1 tab daily, days 1-20 each month. 60 tablet 3   Multiple Vitamins-Minerals (MULTIVITAMIN PO) Take by mouth daily.     valACYclovir (VALTREX) 1000 MG tablet Take 1 tablet (1,000 mg total) by mouth daily. Take as directed. 90 tablet 3   XIIDRA 5 % SOLN 2 (two) times daily.     Cyanocobalamin (VITAMIN B 12 PO) Take 1,000 mg by mouth. (Patient not taking: Reported on 11/27/2020)     UNABLE TO FIND cbd oil (Patient not taking: Reported on 11/27/2020)     No current facility-administered medications for this visit.    Family History  Problem Relation Age of Onset   Colon cancer Paternal Grandmother    Ulcerative colitis Father    Heart disease Mother    Hypertension Mother     Review of Systems  All other systems reviewed and are negative.  Exam:   BP (!) 145/80 (BP Location: Right Arm, Patient Position: Sitting, Cuff Size: Large)   Pulse 85   Ht 5' (1.524 m)   Wt 142 lb 12.8 oz (64.8 kg)  BMI 27.89 kg/m   Height: 5' (152.4 cm)  General appearance: alert, cooperative and appears stated age Breasts: normal appearance, no masses or tenderness Abdomen: soft, non-tender; bowel sounds normal; no masses,  no organomegaly Lymph nodes: Cervical, supraclavicular, and axillary nodes normal.  No abnormal inguinal nodes palpated Neurologic: Grossly normal  Pelvic: External genitalia:  no lesions              Urethra:  normal appearing urethra with no masses, tenderness or lesions              Bartholins and Skenes: normal                 Vagina: normal appearing vagina with atrophic changes and no discharge, no lesions              Cervix: no lesions              Pap taken: Yes.   Bimanual Exam:  Uterus:  normal size, contour, position, consistency, mobility, non-tender              Adnexa: normal adnexa and no mass, fullness, tenderness               Rectovaginal:  Confirms               Anus:  normal sphincter tone, no lesions  Chaperone, Ina Homes, CMA, was present for exam.  Assessment/Plan: 1. GYN exam for high-risk Medicare patient - pap obtained today - MMG 03/2020 - Colonoscopy 12/2015, follow up 10 years - BMD 01/2015 - lab work done with Dr. Waynard Edwards - care gaps reviewed/updated  2. Postmenopausal HRT (hormone replacement therapy) - on estradiol 0.25mg  daily and provera 2.5mg  days 1-20.  Desires to continue.  Doing well on this.  Does not need RFs.  3. Stress incontinence of urine - ready for evaluation and treatment - Ambulatory referral to Urogynecology  4. HSV infection - does not typically have symptoms.  Does not need RF for valcyclovir  5. Cervical cancer screening - PR OBTAINING SCREEN PAP SMEAR - Cytology - PAP( [place])

## 2020-11-28 LAB — CYTOLOGY - PAP
Adequacy: ABSENT
Diagnosis: NEGATIVE

## 2020-11-29 ENCOUNTER — Encounter (HOSPITAL_BASED_OUTPATIENT_CLINIC_OR_DEPARTMENT_OTHER): Payer: Self-pay | Admitting: Obstetrics & Gynecology

## 2020-12-01 ENCOUNTER — Other Ambulatory Visit (HOSPITAL_BASED_OUTPATIENT_CLINIC_OR_DEPARTMENT_OTHER): Payer: Self-pay | Admitting: *Deleted

## 2020-12-01 MED ORDER — FLUCONAZOLE 150 MG PO TABS: 150.0000 mg | ORAL_TABLET | Freq: Once | ORAL | 0 refills | Status: AC

## 2020-12-01 NOTE — Progress Notes (Signed)
Rx sent for Diflucan to treat yeast that was present on PAP smear.

## 2020-12-04 ENCOUNTER — Encounter (HOSPITAL_BASED_OUTPATIENT_CLINIC_OR_DEPARTMENT_OTHER): Payer: Self-pay | Admitting: *Deleted

## 2021-02-11 ENCOUNTER — Ambulatory Visit: Payer: Medicare PPO | Admitting: Obstetrics and Gynecology

## 2021-02-11 ENCOUNTER — Other Ambulatory Visit: Payer: Self-pay

## 2021-02-11 ENCOUNTER — Encounter: Payer: Self-pay | Admitting: Obstetrics and Gynecology

## 2021-02-11 VITALS — BP 151/84 | HR 99 | Ht 60.0 in

## 2021-02-11 DIAGNOSIS — R159 Full incontinence of feces: Secondary | ICD-10-CM | POA: Diagnosis not present

## 2021-02-11 DIAGNOSIS — N393 Stress incontinence (female) (male): Secondary | ICD-10-CM | POA: Diagnosis not present

## 2021-02-11 DIAGNOSIS — N3941 Urge incontinence: Secondary | ICD-10-CM | POA: Diagnosis not present

## 2021-02-11 LAB — POCT URINALYSIS DIPSTICK
Appearance: NORMAL
Bilirubin, UA: NEGATIVE
Blood, UA: NEGATIVE
Glucose, UA: NEGATIVE
Ketones, UA: NEGATIVE
Leukocytes, UA: NEGATIVE
Nitrite, UA: NEGATIVE
Protein, UA: NEGATIVE
Spec Grav, UA: 1.015 (ref 1.010–1.025)
Urobilinogen, UA: 0.2 E.U./dL
pH, UA: 7 (ref 5.0–8.0)

## 2021-02-11 NOTE — Patient Instructions (Signed)
Accidental Bowel Leakage: Our goal is to achieve formed bowel movements daily or every-other-day without leakage.  You may need to try different combinations of the following options to find what works best for you.  Some management options include: Dietary changes (more leafy greens, vegetables and fruits; less processed foods) Fiber supplementation (Metamucil or something with psyllium as active ingredient) Over-the-counter imodium (tablets or liquid) to help solidify the stool and prevent leakage of stool. If you get constipated you can use Miralax as needed to achieve bowel movements.  

## 2021-02-11 NOTE — Progress Notes (Signed)
[facility] New Patient Evaluation and Consultation  Referring Provider: Megan Salon, MD PCP: Crist Infante, MD Date of Service: 02/11/2021  SUBJECTIVE Chief Complaint: New Patient (Initial Visit) Joan White is a 66 y.o. female here for a incontinence. Pt says her bowels are inconsistent.)  History of Present Illness: Joan White is a 66 y.o. White or Caucasian female seen in consultation at the request of Dr. Sabra Heck for evaluation of incontinence.    Review of records from Dr Sabra Heck significant for: Has leakage with coughing, sneezing or full bladder.  Urinary Symptoms: Leaks urine with cough/ sneeze, laughing, and with a full bladder. SUI > UUI Leaks a few times a week Pad use: 1 pads per day.   She is bothered by her UI symptoms.  Day time voids 5.  Nocturia: 0-1 times per night to void. Voiding dysfunction: she empties her bladder well.  does not use a catheter to empty bladder.  When urinating, she feels difficulty starting urine stream and dribbling after finishing   UTIs:  0  UTI's in the last year.   Denies history of blood in urine and kidney or bladder stones  Pelvic Organ Prolapse Symptoms:                  She Denies a feeling of a bulge the vaginal area.   Bowel Symptom: Bowel movements: inconsistent- 2-3 times per day or several days in between Stool consistency: hard or soft  Straining: yes, sometimes  Splinting: no.  Incomplete evacuation: yes, sometimes She Admits to accidental bowel leakage / fecal incontinence  Occurs: when she has freqeunt BM- about 3-4 times in the last 6 months  Consistency with leakage: soft  Bowel regimen:  magnesium 500mg  daily Last colonoscopy: Date 2017, Results- negative  Sexual Function Sexually active: no- because of pain Pain with sex: Yes, at the vaginal opening, has discomfort due to dryness Tried vaginal estrogen but did not help, so stopped using it  Pelvic Pain Denies pelvic  pain   Past Medical History:  Past Medical History:  Diagnosis Date   Abnormal Pap smear    h/o cryo in her 46's   Arthritis    Depression    HSV infection    Migraine      Past Surgical History:   Past Surgical History:  Procedure Laterality Date   BTSP     CATARACT EXTRACTION, BILATERAL  2019   CESAREAN SECTION     GYNECOLOGIC CRYOSURGERY     NASAL SEPTUM SURGERY     SHOULDER SURGERY Right 06/2016   Dr. Ronnie Derby      Past OB/GYN History: OB History  Gravida Para Term Preterm AB Living  2 2       2   SAB IAB Ectopic Multiple Live Births               # Outcome Date GA Lbr Len/2nd Weight Sex Delivery Anes PTL Lv  2 Para 1985   6 lb 3 oz (2.807 kg) M      1 Para 1982   8 lb 5 oz (3.771 kg) M        Vaginal deliveries: 0,  Forceps/ Vacuum deliveries: 0, Cesarean section: 2 Menopausal: Yes, Denies vaginal bleeding since menopause Last pap smear was 2022.  Any history of abnormal pap smears: no.   Medications: She has a current medication list which includes the following prescription(s): vitamin d, cyanocobalamin, duloxetine, duloxetine, estradiol, magnesium, medroxyprogesterone, multiple vitamin, UNABLE  TO FIND, valacyclovir, and xiidra.   Allergies: Patient is allergic to sulfa antibiotics.   Social History:  Social History   Tobacco Use   Smoking status: Never   Smokeless tobacco: Never  Vaping Use   Vaping Use: Never used  Substance Use Topics   Alcohol use: Yes    Alcohol/week: 2.0 standard drinks    Types: 2 Glasses of wine per week   Drug use: No    Relationship status: married She lives with husband.   She is not employed. Regular exercise: No History of abuse: No  Family History:   Family History  Problem Relation Age of Onset   Heart disease Mother    Hypertension Mother    Ulcerative colitis Father    Colon cancer Paternal Grandmother      Review of Systems: Review of Systems  Constitutional:  Positive for malaise/fatigue. Negative  for fever and weight loss.  Respiratory:  Negative for cough, shortness of breath and wheezing.   Cardiovascular:  Negative for chest pain, palpitations and leg swelling.  Gastrointestinal:  Negative for abdominal pain and blood in stool.  Genitourinary:  Negative for dysuria.  Musculoskeletal:  Positive for myalgias.  Skin:  Negative for rash.  Neurological:  Positive for headaches. Negative for dizziness.  Endo/Heme/Allergies:  Bruises/bleeds easily.  Psychiatric/Behavioral:  Negative for depression. The patient is not nervous/anxious.     OBJECTIVE Physical Exam: Vitals:   02/11/21 0947  BP: (!) 151/84  Pulse: 99  Height: 5' (1.524 m)    Physical Exam Constitutional:      General: She is not in acute distress. Pulmonary:     Effort: Pulmonary effort is normal.  Abdominal:     General: There is no distension.     Palpations: Abdomen is soft.     Tenderness: There is no abdominal tenderness. There is no rebound.  Musculoskeletal:        General: No swelling. Normal range of motion.  Skin:    General: Skin is warm and dry.     Findings: No rash.  Neurological:     Mental Status: She is alert and oriented to person, place, and time.  Psychiatric:        Mood and Affect: Mood normal.        Behavior: Behavior normal.     GU / Detailed Urogynecologic Evaluation:  Pelvic Exam: Normal external female genitalia; Bartholin's and Skene's glands normal in appearance; urethral meatus normal in appearance, no urethral masses or discharge.   CST: negative  Speculum exam reveals normal vaginal mucosa with atrophy. Cervix normal appearance. Uterus normal single, nontender. Adnexa no mass, fullness, tenderness.     Pelvic floor strength II/V  Pelvic floor musculature: Right levator non-tender, Right obturator non-tender, Left levator non-tender, Left obturator non-tender  POP-Q:   POP-Q  -3                                            Aa   -3                                            Ba  -7  C   3                                            Gh  4                                            Pb  9                                            tvl   -2                                            Ap  -2                                            Bp  -8.5                                              D     Rectal Exam:  Normal sphincter tone, no distal rectocele, enterocoele not present, no rectal masses, no sign of dyssynergia when asking the patient to bear down.  Post-Void Residual (PVR) by Bladder Scan: In order to evaluate bladder emptying, we discussed obtaining a postvoid residual and she agreed to this procedure.  Procedure: The ultrasound unit was placed on the patient's abdomen in the suprapubic region after the patient had voided. A PVR of 40 ml was obtained by bladder scan.  Laboratory Results: POC urine: negative   ASSESSMENT AND PLAN Joan White is a 66 y.o. with:  1. SUI (stress urinary incontinence, female)   2. Urge incontinence   3. Incontinence of feces, unspecified fecal incontinence type    SUI - For treatment of stress urinary incontinence,  non-surgical options include expectant management, weight loss, physical therapy, as well as a pessary.  Surgical options include a midurethral sling, Burch urethropexy, and transurethral injection of a bulking agent. - She is not sure what she would like to try. She will consider her options and let us know. If she decides on a surgical procedure, then will need urodynamic testing.   2. Urge incontinence - not as bothersome  3. Fecal incontinence - Treatment options include anti-diarrhea medication (loperamide/ Imodium OTC or prescription lomotil), fiber supplements, physical therapy, and possible sacral neuromodulation or surgery.  She will try adding a psyllium fiber supplement.    Jaquita Folds, MD

## 2021-02-16 ENCOUNTER — Encounter: Payer: Self-pay | Admitting: Obstetrics and Gynecology

## 2021-02-20 DIAGNOSIS — H11152 Pinguecula, left eye: Secondary | ICD-10-CM | POA: Diagnosis not present

## 2021-02-20 DIAGNOSIS — H02052 Trichiasis without entropian right lower eyelid: Secondary | ICD-10-CM | POA: Diagnosis not present

## 2021-02-20 DIAGNOSIS — H04123 Dry eye syndrome of bilateral lacrimal glands: Secondary | ICD-10-CM | POA: Diagnosis not present

## 2021-02-20 DIAGNOSIS — Z961 Presence of intraocular lens: Secondary | ICD-10-CM | POA: Diagnosis not present

## 2021-02-25 ENCOUNTER — Ambulatory Visit (INDEPENDENT_AMBULATORY_CARE_PROVIDER_SITE_OTHER): Payer: Medicare PPO | Admitting: Obstetrics and Gynecology

## 2021-02-25 ENCOUNTER — Encounter: Payer: Self-pay | Admitting: Obstetrics and Gynecology

## 2021-02-25 ENCOUNTER — Other Ambulatory Visit: Payer: Self-pay

## 2021-02-25 VITALS — BP 166/92 | HR 95

## 2021-02-25 DIAGNOSIS — N393 Stress incontinence (female) (male): Secondary | ICD-10-CM

## 2021-02-25 NOTE — Progress Notes (Signed)
[facility] Return Visit  SUBJECTIVE  History of Present Illness: Joan White is a 66 y.o. female seen in follow-up for stress urinary incontinence. She is interested in proceeding with a surgical procedure.   Past Medical History: Patient  has a past medical history of Abnormal Pap smear, Arthritis, Depression, HSV infection, and Migraine.   Past Surgical History: She  has a past surgical history that includes BTSP; Cesarean section; Nasal septum surgery; Gynecologic cryosurgery; Shoulder surgery (Right, 06/2016); and Cataract extraction, bilateral (2019).   Medications: She has a current medication list which includes the following prescription(s): vitamin d, cyanocobalamin, duloxetine, duloxetine, estradiol, magnesium, medroxyprogesterone, multiple vitamin, UNABLE TO FIND, valacyclovir, and xiidra.   Allergies: Patient is allergic to sulfa antibiotics.   Social History: Patient  reports that she has never smoked. She has never used smokeless tobacco. She reports current alcohol use of about 2.0 standard drinks per week. She reports that she does not use drugs.      OBJECTIVE     Physical Exam: Vitals:   02/25/21 1546  BP: (!) 166/92  Pulse: 95   Gen: No apparent distress, A&O x 3.  Detailed Urogynecologic Evaluation:  Normal external genitalia Prior exam showed:  POP-Q (02/11/21):    POP-Q   -3                                            Aa   -3                                           Ba   -7                                              C    3                                            Gh   4                                            Pb   9                                            tvl    -2                                            Ap   -2                                            Bp   -8.5  D       Verbal consent was obtained to perform simple CMG procedure:   Prolapse was reduced  using 2 large cotton swabs. Urethra was prepped with betadine and a 51F catheter was placed and bladder was drained completely. The bladder was then backfilled with sterile water by gravity.  First sensation: 200 First Desire: 250 Strong Desire: 300 Capacity: 375 Cough stress test was positive. Valsalva stress test was not done.  No detrusor overactivity noted with filling. Catheter was placed to drain the bladder. M  Interpretation: CMG showed normal sensation, and normal cystometric capacity. Findings positive for stress incontinence, negative for detrusor overactivity.     ASSESSMENT AND PLAN    Joan White is a 66 y.o. with:  No diagnosis found.  Plan for surgery: Exam under anesthesia, midurethral sling and cystoscopy  - We reviewed the patient's specific anatomic and functional findings, with the assistance of diagrams, and together finalized the above procedure. The planned surgical procedures were discussed along with the surgical risks outlined below, which were also provided on a detailed handout. Additional treatment options including expectant management, conservative management, medical management were discussed where appropriate.  We reviewed the benefits and risks of each treatment option.   General Surgical Risks: For all procedures, there are risks of bleeding, infection, damage to surrounding organs including but not limited to bowel, bladder, blood vessels, ureters and nerves, and need for further surgery if an injury were to occur. These risks are all low with minimally invasive surgery.   There are risks of numbness and weakness at any body site or buttock/rectal pain.  It is possible that baseline pain can be worsened by surgery, either with or without mesh. If surgery is vaginal, there is also a low risk of possible conversion to laparoscopy or open abdominal incision where indicated. Very rare risks include blood transfusion, blood clot, heart attack, pneumonia, or  death.   There is also a risk of short-term postoperative urinary retention with need to use a catheter. About half of patients need to go home from surgery with a catheter, which is then later removed in the office. The risk of long-term need for a catheter is very low. There is also a risk of worsening of overactive bladder.   Sling: The effectiveness of a midurethral vaginal mesh sling is approximately 85%, and thus, there will be times when you may leak urine after surgery, especially if your bladder is full or if you have a strong cough. There is a balance between making the sling tight enough to treat your leakage but not too tight so that you have long-term difficulty emptying your bladder. A mesh sling will not directly treat overactive bladder/urge incontinence and may worsen it.  There is an FDA safety notification on vaginal mesh procedures for prolapse but NOT mesh slings. We have extensive experience and training with mesh placement and we have close postoperative follow up to identify any potential complications from mesh. It is important to realize that this mesh is a permanent implant that cannot be easily removed. There are rare risks of mesh exposure (2-4%), pain with intercourse (0-7%), and infection (<1%). The risk of mesh exposure if more likely in a woman with risks for poor healing (prior radiation, poorly controlled diabetes, or immunocompromised). The risk of new or worsened chronic pain after mesh implant is more common in women with baseline chronic pain and/or poorly controlled anxiety or depression. Approximately 2-4% of patients will experience longer-term post-operative voiding dysfunction  that may require surgical revision of the sling. We also reviewed that postoperatively, her stream may not be as strong as before surgery.   Today we reviewed pre-operative preparation, peri-operative expectations, and post-operative instructions/recovery.  She was provided with instructional  handouts. She understands not to take aspirin (>81mg ) or NSAIDs 7 days prior to surgery. Prescriptions provided for: Oxycodone 5mg , Ibuprofen 600mg , Tylenol 500mg , Miralax. These prescriptions will be sent prior to surgery.  - Medical clearance: not required  - Anticoagulant use: No - Medicaid Hysterectomy form: No - Accepts blood transfusion: No - Expected length of stay: outpatient  Request sent for surgery scheduling.   , MD   Time spent: I spent 30 minutes dedicated to the care of this patient on the date of this encounter to include pre-visit review of records, face-to-face time with the patient discussing surgery and post visit documentation. Additional time was spent on the procedure.

## 2021-02-25 NOTE — Patient Instructions (Addendum)
General Surgical Risks: For all procedures, there are risks of bleeding, infection, damage to surrounding organs including but not limited to bowel, bladder, blood vessels, ureters and nerves, and need for further surgery if an injury were to occur. These risks are all low with minimally invasive surgery.    Very rare risks include blood transfusion, blood clot, heart attack, pneumonia, or death.    There is also a risk of short-term postoperative urinary retention with need to use a catheter. About half of patients need to go home from surgery with a catheter, which is then later removed in the office. The risk of long-term need for a catheter is very low. There is also a risk of worsening of overactive bladder.    Sling: The effectiveness of a midurethral vaginal mesh sling is approximately 85%, and thus, there will be times when you may leak urine after surgery, especially if your bladder is full or if you have a strong cough. There is a balance between making the sling tight enough to treat your leakage but not too tight so that you have long-term difficulty emptying your bladder. A mesh sling will not directly treat overactive bladder/urge incontinence and may worsen it.  There is an FDA safety notification on vaginal mesh procedures for prolapse but NOT mesh slings. We have extensive experience and training with mesh placement and we have close postoperative follow up to identify any potential complications from mesh. It is important to realize that this mesh is a permanent implant that cannot be easily removed. There are rare risks of mesh exposure (2-4%), pain with intercourse (0-7%), and infection (<1%). The risk of mesh exposure if more likely in a woman with risks for poor healing (prior radiation, poorly controlled diabetes, or immunocompromised). The risk of new or worsened chronic pain after mesh implant is more common in women with baseline chronic pain and/or poorly controlled anxiety or  depression. Approximately 2-4% of patients will experience longer-term post-operative voiding dysfunction that may require surgical revision of the sling. We also reviewed that postoperatively, her stream may not be as strong as before surgery.   POST OPERATIVE INSTRUCTIONS  General Instructions Recovery (not bed rest) will last approximately 6 weeks Walking is encouraged, but refrain from strenuous exercise/ housework/ heavy lifting. No lifting >10lbs  Nothing in the vagina- NO intercourse, tampons or douching Bathing:  Do not submerge in water (NO swimming, bath, hot tub, etc) until after your postop visit. You can shower starting the day after surgery.  No driving until you are not taking narcotic pain medicine and until your pain is well enough controlled that you can slam on the breaks or make sudden movements if needed.   Taking your medications Please take your acetaminophen and ibuprofen on a schedule for the first 48 hours. Take 600mg  ibuprofen, then take 500mg  acetaminophen 3 hours later, then continue to alternate ibuprofen and acetaminophen. That way you are taking each type of medication every 6 hours. Take the prescribed narcotic (oxycodone, tramadol, etc) as needed, with a maximum being every 4 hours.  Take a stool softener daily to keep your stools soft and preventing you from straining. If you have diarrhea, you decrease your stool softener. This is explained more below. We have prescribed you Miralax.  Reasons to Call the Nurse (see last page for phone numbers) Heavy Bleeding (changing your pad every 1-2 hours) Persistent nausea/vomiting Fever (100.4 degrees or more) Incision problems (pus or other fluid coming out, redness, warmth, increased pain)  Things to Expect After Surgery Mild to Moderate pain is normal during the first day or two after surgery. If prescribed, take Ibuprofen or Tylenol first and use the stronger medicine for break-through pain. You can overlap  these medicines because they work differently.   Constipation   To Prevent Constipation:  Eat a well-balanced diet including protein, grains, fresh fruit and vegetables.  Drink plenty of fluids. Walk regularly.  Depending on specific instructions from your physician: take Miralax daily and additionally you can add a stool softener (colace/ docusate) and fiber supplement. Continue as long as you're on pain medications.   To Treat Constipation:  If you do not have a bowel movement in 2 days after surgery, you can take 2 Tbs of Milk of Magnesia 1-2 times a day until you have a bowel movement. If diarrhea occurs, decrease the amount or stop the laxative. If no results with Milk of Magnesia, you can drink a bottle of magnesium citrate which you can purchase over the counter.  Fatigue:  This is a normal response to surgery and will improve with time.  Plan frequent rest periods throughout the day.  Gas Pain:  This is very common but can also be very painful! Drink warm liquids such as herbal teas, bouillon or soup. Walking will help you pass more gas.  Mylicon or Gas-X can be taken over the counter.  Leaking Urine:  Varying amounts of leakage may occur after surgery.  This should improve with time. Your bladder needs at least 3 months to recover from surgery. If you leak after surgery, be sure to mention this to your doctor at your post-op visit. If you were taking medications for overactive bladder prior to surgery, be sure to restart the medications immediately after surgery.  Incisions: If you have incisions on your abdomen, the skin glue will dissolve on its own over time. It is ok to gently rinse with soap and water over these incisions but do not scrub.  Catheter Approximately 50% of patients are unable to urinate after surgery and need to go home with a catheter. This allows your bladder to rest so it can return to full function. If you go home with a catheter, the office will call to set up a  voiding trial a few days after surgery. For most patients, by this visit, they are able to urinate on their own. Long term catheter use is rare.   Return to Work  As work demands and recovery times vary widely, it is hard to predict when you will want to return to work. If you have a desk job with no strenuous physical activity, and if you would like to return sooner than generally recommended, discuss this with your provider or call our office.   Post op concerns  For non-emergent issues, please call the Urogynecology Nurse. Please leave a message and someone will contact you within one business day.  You can also send a message through [facility].   AFTER HOURS (After 5:00 PM and on weekends):  For urgent matters that cannot wait until the next business day. Call our office [phone] and connect to the doctor on call.  Please reserve this for important issues.   **FOR ANY TRUE EMERGENCY ISSUES CALL 911 OR GO TO THE NEAREST EMERGENCY ROOM.** Please inform our office or the doctor on call of any emergency.     APPOINTMENTS: Call [phone]

## 2021-03-03 ENCOUNTER — Encounter (HOSPITAL_BASED_OUTPATIENT_CLINIC_OR_DEPARTMENT_OTHER): Payer: Self-pay | Admitting: Obstetrics and Gynecology

## 2021-03-03 ENCOUNTER — Other Ambulatory Visit: Payer: Self-pay

## 2021-03-03 NOTE — Progress Notes (Addendum)
Spoke w/ via phone for pre-op interview---pt Lab needs dos----  none per anesthesia, surgery orders req dr schroeder epic ib aRRIVE 1230 PM 03-09-2021  NO food after midnight, clear liquids until ---1130 am Med rec completed Medications to take morning of surgery -----Cymbalta, Estradiol, Valtrex, Xiidra eye drop Diabetic medication -----n/a Patient instructed no nail polish to be worn day of surgery Patient instructed to bring photo id and insurance card day of surgery Patient aware to have Driver (ride ) / caregiver    for 24 hours after surgery  spouse Aurther Loft will stay Patient Special Instructions -----bring cpap mask tubing & machine and leave in car Pre-Op special Istructions -----none Patient verbalized understanding of instructions that were given at this phone interview. Patient denies shortness of breath, chest pain, fever, cough at this phone interview.

## 2021-03-05 ENCOUNTER — Other Ambulatory Visit: Payer: Self-pay | Admitting: Obstetrics and Gynecology

## 2021-03-05 DIAGNOSIS — Z01818 Encounter for other preprocedural examination: Secondary | ICD-10-CM

## 2021-03-05 MED ORDER — IBUPROFEN 600 MG PO TABS: 600.0000 mg | ORAL_TABLET | Freq: Four times a day (QID) | ORAL | 0 refills | Status: DC | PRN

## 2021-03-05 MED ORDER — ACETAMINOPHEN 500 MG PO TABS: 500.0000 mg | ORAL_TABLET | Freq: Four times a day (QID) | ORAL | 0 refills | Status: DC | PRN

## 2021-03-05 MED ORDER — OXYCODONE HCL 5 MG PO TABS: 5.0000 mg | ORAL_TABLET | ORAL | 0 refills | Status: DC | PRN

## 2021-03-05 NOTE — H&P (Signed)
[facility] Pre-Operative H&P  Subjective Chief Complaint: Joan White presents for a preoperative encounter.   History of Present Illness: Joan White is a 66 y.o. female who presents for preoperative visit.  She is scheduled to undergo Exam under anesthesia, midurethral sling and cystoscopy on 03/09/21.   CMG showed normal sensation, and normal cystometric capacity. Findings positive for stress incontinence, negative for detrusor overactivity.   Past Medical History:  Diagnosis Date   Abnormal Pap smear    h/o cryo in her 67's   Arthritis    oa   Constipation    Depression    situational no meds taken   Dry eyes    Genital herpes    History of COVID-19 2021   fatigie x 5 to 6 days all symptoms resolved   History of migraine headaches    HSV infection    left leg aches @ times    Sleep apnea    SUI (stress urinary incontinence, female)    pads prn   Wears glasses      Past Surgical History:  Procedure Laterality Date   CATARACT EXTRACTION, BILATERAL  2019   CESAREAN SECTION     1982 and 1985   colonscopy     last done 2017   GYNECOLOGIC CRYOSURGERY     yrs ago   NASAL SEPTUM SURGERY     age 46   SHOULDER SURGERY Right 06/2016   Dr. Ronnie Derby arthroscopic for bone spurs   [facility]    is allergic to sulfa antibiotics.   Family History  Problem Relation Age of Onset   Heart disease Mother    Hypertension Mother    Ulcerative colitis Father    Colon cancer Paternal Grandmother     Social History   Tobacco Use   Smoking status: Never   Smokeless tobacco: Never  Vaping Use   Vaping Use: Never used  Substance Use Topics   Alcohol use: Yes    Alcohol/week: 2.0 standard drinks    Types: 2 Glasses of wine per week    Comment: gin and tonic 1 to 2 per night   Drug use: No     Review of Systems was negative for a full 10 system review except as noted in the History of Present Illness.  No current facility-administered  medications for this encounter.  Current Outpatient Medications:    Calcium Carb-Cholecalciferol (CALCIUM 600+D) 600-10 MG-MCG TABS, Take by mouth daily at 6 (six) AM., Disp: , Rfl:    psyllium (METAMUCIL) 58.6 % powder, Take 1 packet by mouth daily. 2 tablespoons, Disp: , Rfl:    Cholecalciferol (VITAMIN D PO), Take 1,000 Int'l Units by mouth., Disp: , Rfl:    DULoxetine (CYMBALTA) 30 MG capsule, Take 30 mg by mouth daily. Take in addition to the 60mg  capsule., Disp: , Rfl:    DULoxetine (CYMBALTA) 60 MG capsule, Take 1 capsule (60 mg total) by mouth daily., Disp: 90 capsule, Rfl: 4   estradiol (ESTRACE) 0.5 MG tablet, TAKE 1 TABLET BY MOUTH ONCE (1) DAILY (Patient taking differently: 0.25 mg. TAKE 1 TABLET BY MOUTH ONCE (1) DAILY @ 1100 am), Disp: 90 tablet, Rfl: 3   MAGNESIUM PO, Take 750 mg by mouth at bedtime., Disp: , Rfl:    medroxyPROGESTERone (PROVERA) 2.5 MG tablet, 1 tab daily, days 1-20 each month., Disp: 60 tablet, Rfl: 3   Multiple Vitamins-Minerals (MULTIVITAMIN PO), Take by mouth daily., Disp: , Rfl:    valACYclovir (  VALTREX) 1000 MG tablet, Take 1 tablet (1,000 mg total) by mouth daily. Take as directed. (Patient taking differently: Take 500 mg by mouth daily. Take as directed. 500 mg @ 1100 am daily), Disp: 90 tablet, Rfl: 3   XIIDRA 5 % SOLN, 2 (two) times daily., Disp: , Rfl:    Objective Gen: No apparent distress, A&O x 3.  Previous Pelvic Exam showed: POP-Q (02/11/21):    POP-Q   -3                                            Aa   -3                                           Ba   -7                                              C    3                                            Gh   4                                            Pb   9                                            tvl    -2                                            Ap   -2                                            Bp   -8.5                                              D            Assessment/ Plan  The patient is a 66 y.o. year old with SUI scheduled to undergo Exam under anesthesia, midurethral sling and cystoscopy. Verbal consent was obtained for these procedures.   Jaquita Folds, MD

## 2021-03-05 NOTE — Progress Notes (Unsigned)
Oxycodone, tylenol and ibuprofen were sent to the pharmacy for patient to pick up prior to surgery. Please inform patient.

## 2021-03-05 NOTE — Progress Notes (Signed)
Pt was notified.  

## 2021-03-09 ENCOUNTER — Ambulatory Visit (HOSPITAL_BASED_OUTPATIENT_CLINIC_OR_DEPARTMENT_OTHER): Payer: Medicare PPO | Admitting: Anesthesiology

## 2021-03-09 ENCOUNTER — Ambulatory Visit (HOSPITAL_BASED_OUTPATIENT_CLINIC_OR_DEPARTMENT_OTHER)
Admission: RE | Admit: 2021-03-09 | Discharge: 2021-03-09 | Disposition: A | Discharge status: Home / Self Care | Payer: Medicare PPO | Attending: Obstetrics and Gynecology | Admitting: Obstetrics and Gynecology

## 2021-03-09 ENCOUNTER — Encounter (HOSPITAL_BASED_OUTPATIENT_CLINIC_OR_DEPARTMENT_OTHER): Payer: Self-pay | Admitting: Obstetrics and Gynecology

## 2021-03-09 ENCOUNTER — Encounter (HOSPITAL_BASED_OUTPATIENT_CLINIC_OR_DEPARTMENT_OTHER)
Admission: RE | Disposition: A | Discharge status: Home / Self Care | Payer: Self-pay | Source: Home / Self Care | Attending: Obstetrics and Gynecology

## 2021-03-09 ENCOUNTER — Other Ambulatory Visit: Payer: Self-pay

## 2021-03-09 ENCOUNTER — Telehealth: Payer: Self-pay | Admitting: Obstetrics and Gynecology

## 2021-03-09 DIAGNOSIS — G4730 Sleep apnea, unspecified: Secondary | ICD-10-CM | POA: Insufficient documentation

## 2021-03-09 DIAGNOSIS — N393 Stress incontinence (female) (male): Secondary | ICD-10-CM | POA: Insufficient documentation

## 2021-03-09 DIAGNOSIS — F32A Depression, unspecified: Secondary | ICD-10-CM | POA: Diagnosis not present

## 2021-03-09 HISTORY — DX: Stress incontinence (female) (male): N39.3

## 2021-03-09 HISTORY — PX: CYSTOSCOPY: SHX5120

## 2021-03-09 HISTORY — DX: Presence of spectacles and contact lenses: Z97.3

## 2021-03-09 HISTORY — DX: Pain in unspecified joint: M25.50

## 2021-03-09 HISTORY — DX: Dry eye syndrome of bilateral lacrimal glands: H04.123

## 2021-03-09 HISTORY — DX: Constipation, unspecified: K59.00

## 2021-03-09 HISTORY — PX: BLADDER SUSPENSION: SHX72

## 2021-03-09 HISTORY — DX: Sleep apnea, unspecified: G47.30

## 2021-03-09 HISTORY — DX: Personal history of other diseases of the nervous system and sense organs: Z86.69

## 2021-03-09 HISTORY — DX: Herpesviral infection of urogenital system, unspecified: A60.00

## 2021-03-09 SURGERY — URETHROPEXY, USING TRANSVAGINAL TAPE
Anesthesia: General | Site: Vagina

## 2021-03-09 MED ORDER — ONDANSETRON HCL 4 MG/2ML IJ SOLN
INTRAMUSCULAR | Status: DC | PRN
  Administered 2021-03-09: 4 mg via INTRAVENOUS

## 2021-03-09 MED ORDER — LACTATED RINGERS IV SOLN: INTRAVENOUS | Status: DC

## 2021-03-09 MED ORDER — PHENAZOPYRIDINE HCL 100 MG PO TABS
200.0000 mg | ORAL_TABLET | ORAL | Status: AC
  Administered 2021-03-09: 200 mg via ORAL

## 2021-03-09 MED ORDER — SCOPOLAMINE 1 MG/3DAYS TD PT72
1.0000 | MEDICATED_PATCH | TRANSDERMAL | Status: DC
  Administered 2021-03-09: 1.5 mg via TRANSDERMAL

## 2021-03-09 MED ORDER — SCOPOLAMINE 1 MG/3DAYS TD PT72
MEDICATED_PATCH | TRANSDERMAL | Status: AC
  Filled 2021-03-09: qty 1

## 2021-03-09 MED ORDER — PHENYLEPHRINE 40 MCG/ML (10ML) SYRINGE FOR IV PUSH (FOR BLOOD PRESSURE SUPPORT)
PREFILLED_SYRINGE | INTRAVENOUS | Status: DC | PRN
  Administered 2021-03-09: 80 ug via INTRAVENOUS

## 2021-03-09 MED ORDER — FENTANYL CITRATE (PF) 100 MCG/2ML IJ SOLN
INTRAMUSCULAR | Status: AC
  Filled 2021-03-09: qty 2

## 2021-03-09 MED ORDER — PHENYLEPHRINE 40 MCG/ML (10ML) SYRINGE FOR IV PUSH (FOR BLOOD PRESSURE SUPPORT)
PREFILLED_SYRINGE | INTRAVENOUS | Status: AC
  Filled 2021-03-09: qty 10

## 2021-03-09 MED ORDER — OXYCODONE HCL 5 MG/5ML PO SOLN
5.0000 mg | Freq: Once | ORAL | Status: DC | PRN
Start: 2021-03-09 — End: 2021-03-09

## 2021-03-09 MED ORDER — SODIUM CHLORIDE 0.9 % IR SOLN
Status: DC | PRN
  Administered 2021-03-09: 500 mL

## 2021-03-09 MED ORDER — CEFAZOLIN SODIUM-DEXTROSE 2-4 GM/100ML-% IV SOLN
INTRAVENOUS | Status: AC
  Filled 2021-03-09: qty 100

## 2021-03-09 MED ORDER — MIDAZOLAM HCL 2 MG/2ML IJ SOLN
INTRAMUSCULAR | Status: AC
  Filled 2021-03-09: qty 2

## 2021-03-09 MED ORDER — AMISULPRIDE (ANTIEMETIC) 5 MG/2ML IV SOLN: 10.0000 mg | Freq: Once | INTRAVENOUS | Status: DC | PRN

## 2021-03-09 MED ORDER — LIDOCAINE-EPINEPHRINE 1 %-1:100000 IJ SOLN
INTRAMUSCULAR | Status: DC | PRN
Start: 2021-03-09 — End: 2021-03-09
  Administered 2021-03-09: 6 mL

## 2021-03-09 MED ORDER — ACETAMINOPHEN 500 MG PO TABS
1000.0000 mg | ORAL_TABLET | ORAL | Status: AC
  Administered 2021-03-09: 1000 mg via ORAL

## 2021-03-09 MED ORDER — DEXAMETHASONE SODIUM PHOSPHATE 10 MG/ML IJ SOLN
INTRAMUSCULAR | Status: DC | PRN
  Administered 2021-03-09: 10 mg via INTRAVENOUS

## 2021-03-09 MED ORDER — FENTANYL CITRATE (PF) 100 MCG/2ML IJ SOLN
INTRAMUSCULAR | Status: DC | PRN
Start: 2021-03-09 — End: 2021-03-09
  Administered 2021-03-09 (×2): 25 ug via INTRAVENOUS
  Administered 2021-03-09: 50 ug via INTRAVENOUS

## 2021-03-09 MED ORDER — PHENAZOPYRIDINE HCL 100 MG PO TABS
ORAL_TABLET | ORAL | Status: AC
  Filled 2021-03-09: qty 2

## 2021-03-09 MED ORDER — PROPOFOL 10 MG/ML IV BOLUS
INTRAVENOUS | Status: DC | PRN
  Administered 2021-03-09: 140 mg via INTRAVENOUS

## 2021-03-09 MED ORDER — CEFAZOLIN SODIUM-DEXTROSE 2-4 GM/100ML-% IV SOLN
2.0000 g | INTRAVENOUS | Status: AC
  Administered 2021-03-09: 2 g via INTRAVENOUS

## 2021-03-09 MED ORDER — SURGIFLO WITH THROMBIN (HEMOSTATIC MATRIX KIT) OPTIME
TOPICAL | Status: DC | PRN
Start: 2021-03-09 — End: 2021-03-09
  Administered 2021-03-09: 1

## 2021-03-09 MED ORDER — MEPERIDINE HCL 25 MG/ML IJ SOLN: 6.2500 mg | INTRAMUSCULAR | Status: DC | PRN

## 2021-03-09 MED ORDER — EPHEDRINE SULFATE-NACL 50-0.9 MG/10ML-% IV SOSY
PREFILLED_SYRINGE | INTRAVENOUS | Status: DC | PRN
  Administered 2021-03-09 (×2): 10 mg via INTRAVENOUS

## 2021-03-09 MED ORDER — OXYCODONE HCL 5 MG PO TABS
5.0000 mg | ORAL_TABLET | Freq: Once | ORAL | Status: DC | PRN
Start: 2021-03-09 — End: 2021-03-09

## 2021-03-09 MED ORDER — SODIUM CHLORIDE 0.9 % IR SOLN
Status: DC | PRN
  Administered 2021-03-09: 400 mL

## 2021-03-09 MED ORDER — FENTANYL CITRATE (PF) 100 MCG/2ML IJ SOLN: 25.0000 ug | INTRAMUSCULAR | Status: DC | PRN

## 2021-03-09 MED ORDER — POVIDONE-IODINE 10 % EX SWAB: 2.0000 "application " | Freq: Once | CUTANEOUS | Status: DC

## 2021-03-09 MED ORDER — LIDOCAINE 2% (20 MG/ML) 5 ML SYRINGE
INTRAMUSCULAR | Status: DC | PRN
  Administered 2021-03-09: 60 mg via INTRAVENOUS

## 2021-03-09 MED ORDER — ACETAMINOPHEN 500 MG PO TABS
ORAL_TABLET | ORAL | Status: AC
  Filled 2021-03-09: qty 2

## 2021-03-09 MED ORDER — EPHEDRINE 5 MG/ML INJ
INTRAVENOUS | Status: AC
  Filled 2021-03-09: qty 5

## 2021-03-09 MED ORDER — MIDAZOLAM HCL 5 MG/5ML IJ SOLN
INTRAMUSCULAR | Status: DC | PRN
  Administered 2021-03-09: 2 mg via INTRAVENOUS

## 2021-03-09 SURGICAL SUPPLY — 43 items
ADH SKN CLS APL DERMABOND .7 (GAUZE/BANDAGES/DRESSINGS) ×2
AGENT HMST KT MTR STRL THRMB (HEMOSTASIS) ×2
BLADE CLIPPER SENSICLIP SURGIC (BLADE) ×2 IMPLANT
BLADE SURG 15 STRL LF DISP TIS (BLADE) ×2 IMPLANT
BLADE SURG 15 STRL SS (BLADE) ×3
DECANTER SPIKE VIAL GLASS SM (MISCELLANEOUS) ×2 IMPLANT
DERMABOND ADVANCED (GAUZE/BANDAGES/DRESSINGS) ×1
DERMABOND ADVANCED .7 DNX12 (GAUZE/BANDAGES/DRESSINGS) ×2 IMPLANT
DEVICE CAPIO SLIM SINGLE (INSTRUMENTS) IMPLANT
ELECT REM PT RETURN 9FT ADLT (ELECTROSURGICAL) ×3
ELECTRODE REM PT RTRN 9FT ADLT (ELECTROSURGICAL) IMPLANT
GAUZE 4X4 16PLY ~~LOC~~+RFID DBL (SPONGE) ×3 IMPLANT
GLOVE SURG ENC MOIS LTX SZ6 (GLOVE) ×3 IMPLANT
GLOVE SURG UNDER POLY LF SZ6.5 (GLOVE) ×3 IMPLANT
GOWN STRL REUS W/TWL LRG LVL3 (GOWN DISPOSABLE) ×3 IMPLANT
HIBICLENS CHG 4% 4OZ BTL (MISCELLANEOUS) ×3 IMPLANT
HOLDER FOLEY CATH W/STRAP (MISCELLANEOUS) ×3 IMPLANT
IV NS 1000ML (IV SOLUTION) ×3
IV NS 1000ML BAXH (IV SOLUTION) IMPLANT
KIT TURNOVER CYSTO (KITS) ×3 IMPLANT
MANIFOLD NEPTUNE II (INSTRUMENTS) ×3 IMPLANT
NEEDLE HYPO 22GX1.5 SAFETY (NEEDLE) ×3 IMPLANT
NS IRRIG 1000ML POUR BTL (IV SOLUTION) ×2 IMPLANT
NS IRRIG 500ML POUR BTL (IV SOLUTION) ×1 IMPLANT
PACK CYSTO (CUSTOM PROCEDURE TRAY) ×3 IMPLANT
PACK VAGINAL WOMENS (CUSTOM PROCEDURE TRAY) ×3 IMPLANT
RETRACTOR LONE STAR DISPOSABLE (INSTRUMENTS) ×3 IMPLANT
RETRACTOR STAY HOOK 5MM (MISCELLANEOUS) ×3 IMPLANT
SET IRRIG Y TYPE TUR BLADDER L (SET/KITS/TRAYS/PACK) ×3 IMPLANT
SUCTION FRAZIER HANDLE 10FR (MISCELLANEOUS) ×3
SUCTION TUBE FRAZIER 10FR DISP (MISCELLANEOUS) ×2 IMPLANT
SURGIFLO W/THROMBIN 8M KIT (HEMOSTASIS) ×1 IMPLANT
SUT ABS MONO DBL WITH NDL 48IN (SUTURE) IMPLANT
SUT MON AB 2-0 SH 27 (SUTURE) IMPLANT
SUT VIC AB 0 CT1 27 (SUTURE)
SUT VIC AB 0 CT1 27XBRD ANTBC (SUTURE) IMPLANT
SUT VIC AB 2-0 SH 27 (SUTURE) ×3
SUT VIC AB 2-0 SH 27XBRD (SUTURE) ×2 IMPLANT
SUT VICRYL 2-0 SH 8X27 (SUTURE) IMPLANT
SYR BULB EAR ULCER 3OZ GRN STR (SYRINGE) ×3 IMPLANT
SYS SLING ADV FIT BLUE TRNSVAG (Sling) ×1 IMPLANT
TOWEL OR 17X26 10 PK STRL BLUE (TOWEL DISPOSABLE) ×3 IMPLANT
TRAY FOLEY W/BAG SLVR 14FR (SET/KITS/TRAYS/PACK) ×3 IMPLANT

## 2021-03-09 NOTE — Anesthesia Procedure Notes (Signed)
Procedure Name: LMA Insertion Date/Time: 03/09/2021 12:19 PM Performed by: Pearson Grippe, CRNA Pre-anesthesia Checklist: Patient identified, Emergency Drugs available, Suction available and Patient being monitored Patient Re-evaluated:Patient Re-evaluated prior to induction Oxygen Delivery Method: Circle system utilized Preoxygenation: Pre-oxygenation with 100% oxygen Induction Type: IV induction Ventilation: Mask ventilation without difficulty LMA: LMA inserted LMA Size: 4.0 Number of attempts: 1 Airway Equipment and Method: Bite block Placement Confirmation: positive ETCO2 Tube secured with: Tape Dental Injury: Teeth and Oropharynx as per pre-operative assessment

## 2021-03-09 NOTE — Interval H&P Note (Signed)
History and Physical Interval Note:  03/09/2021 11:34 AM  Joan White  has presented today for surgery, with the diagnosis of stress urinary incontinence.  The various methods of treatment have been discussed with the patient and family. After consideration of risks, benefits and other options for treatment, the patient has consented to  Procedure(s): TRANSVAGINAL TAPE (TVT) PROCEDURE (N/A) CYSTOSCOPY (N/A) as a surgical intervention.    Vitals:   03/09/21 1009  BP: (!) 156/84  Pulse: 93  Resp: 16  Temp: 98.6 F (37 C)  SpO2: 98%    Gen: NAD Lungs: normal respirations Abd: soft, nontender   The patient's history has been reviewed, patient examined, no change in status, stable for surgery.  I have reviewed the patient's chart and labs.  Questions were answered to the patient's satisfaction.     Marguerita Beards

## 2021-03-09 NOTE — Op Note (Signed)
Operative Note  Preoperative Diagnosis: stress urinary incontinence  Postoperative Diagnosis: same  Procedures performed:  Exam under anesthesia, midurethral sling (Advantage Fit), cystoscopy  Implants:  Implant Name Type Inv. Item Serial No. Manufacturer Lot No. LRB No. Used Action  SYS SLING ADV FIT BLUE TRNSVAG - [id number] Sling SYS SLING ADV FIT BLUE TRNSVAG  [employer] [id number] N/A 1 Implanted    Attending Surgeon: Lanetta Inch, MD  Anesthesia: General LMA On cystoscopy, normal bladder and urethra without injury, lesion or foreign body. Brisk bilateral ureteral efflux noted.      Specimens: * No specimens in log *  Estimated blood loss: 30 mL  IV fluids: 800 mL  Urine output: 150 mL  Complications: none  Procedure in Detail: After informed consent was obtained, the patient was taken to the operating room where she was placed under anesthesia.  She was then placed in the dorsal lithotomy position with allen stirrups,  and prepped and draped in the usual sterile fashion.  A strap was placed across her upper abdomen on top of her gown so it was not in direct contact with her skin.  Care was taken to avoid hyperflexion or hyperextension of her upper and lower extremities. A foley catheter was placed.  A lonestar self-retraining retractor was placed with 4 stay hooks. The mid urethral area was located on the anterior vaginal wall.  Two Allis clamps were placed at the level of the midurethra. 1% lidocaine with epinephrine was injected into the vaginal mucosa. A vertical incision was made between the two clamps using a 15-blade scalpel.  Using sharp dissection, Metzenbaum scissors were used to make a periurethral tunnel from the vaginal incision towards the pubic rami bilaterally for the future sling tracts. The bladder was ensured to be empty. The trocar and attached sling were introduced into the right side of the periurethral vaginal incision, just inferior to the pubic  symphysis on the right side. The trocar was guided through the endopelvic fascia and directly vertically.  While hugging the cephalad surface of the pubic bone, the trocar was guided out through the abdomen 2 fingerbreadths lateral to midline at the level of the pubic symphysis on the ipsilateral side. The trocar was placed on the left side in a similar fashion.  A 70-degree cystoscope was introduced, and 360-degree inspection revealed no trauma or trocars in the bladder, with brisk bilateral ureteral efflux.  The bladder was drained and the cystoscope was removed.  The Foley catheter was reinserted.  The sling was brought to lie beneath the mid-urethra.  A needle driver was placed behind the sling to ensure no tension.   The plastic sheath was removed from the sling and the distal ends of the sling were trimmed just below the level of the skin incisions.  Tension-free positioning of the sling was confirmed. Vaginal inspection revealed no vaginotomy or sling perforations of the mucosa. Surgiflo was placed into the incision and pressure was held. Good hemosatsis was noted. The vaginal mucosal edges were reapproximated using 2-0 Vicryl.  The vagina was copiously irrigated.  Hemostasis was again noted. Vaginal packing not placed. The suprapubic sling incisions were closed with Dermabond. The patient tolerated the procedure well.  She was awakened from anesthesia and transferred to the recovery room in stable condition. All needle and sponge counts were correct x 2.    Marguerita Beards, MD

## 2021-03-09 NOTE — Telephone Encounter (Signed)
Joan White underwent midurethral sling and cystoscopy on 03/09/21.   She passed her voiding trial.  was backfilled into the bladder Voided  PVR by bladder scan was 47ml.   She was discharged without a catheter. Please call her for a routine post op check. Thanks!  Marguerita Beards, MD

## 2021-03-09 NOTE — Anesthesia Preprocedure Evaluation (Addendum)
Anesthesia Evaluation  Patient identified by MRN, date of birth, ID band Patient awake    Reviewed: Allergy & Precautions, NPO status , Patient's Chart, lab work & pertinent test results  Airway Mallampati: II  TM Distance: >3 FB Neck ROM: Full    Dental no notable dental hx. (+) Dental Advisory Given, Teeth Intact   Pulmonary sleep apnea ,    Pulmonary exam normal breath sounds clear to auscultation       Cardiovascular negative cardio ROS Normal cardiovascular exam Rhythm:Regular Rate:Normal     Neuro/Psych PSYCHIATRIC DISORDERS Depression negative neurological ROS     GI/Hepatic negative GI ROS, Neg liver ROS,   Endo/Other  negative endocrine ROS  Renal/GU negative Renal ROS     Musculoskeletal  (+) Arthritis ,   Abdominal   Peds  Hematology negative hematology ROS (+)   Anesthesia Other Findings   Reproductive/Obstetrics                            Anesthesia Physical Anesthesia Plan  ASA: 2  Anesthesia Plan: General   Post-op Pain Management:    Induction: Intravenous  PONV Risk Score and Plan: 4 or greater and Treatment may vary due to age or medical condition, Ondansetron, Dexamethasone, Midazolam and Diphenhydramine  Airway Management Planned: LMA  Additional Equipment: None  Intra-op Plan:   Post-operative Plan: Extubation in OR  Informed Consent: I have reviewed the patients History and Physical, chart, labs and discussed the procedure including the risks, benefits and alternatives for the proposed anesthesia with the patient or authorized representative who has indicated his/her understanding and acceptance.     Dental advisory given  Plan Discussed with: CRNA  Anesthesia Plan Comments:        Anesthesia Quick Evaluation

## 2021-03-09 NOTE — Anesthesia Postprocedure Evaluation (Signed)
Anesthesia Post Note  Patient: Joan White  Procedure(s) Performed: TRANSVAGINAL TAPE (TVT) PROCEDURE (Vagina ) CYSTOSCOPY (Bladder)     Patient location during evaluation: PACU Anesthesia Type: General Level of consciousness: sedated and patient cooperative Pain management: pain level controlled Vital Signs Assessment: post-procedure vital signs reviewed and stable Respiratory status: spontaneous breathing Cardiovascular status: stable Anesthetic complications: no   No notable events documented.  Last Vitals:  Vitals:   03/09/21 1307 03/09/21 1330  BP: (!) 144/70 136/76  Pulse: 95 (!) 103  Resp: 13 12  Temp: (!) 36.3 C   SpO2: 100% 98%    Last Pain:  Vitals:   03/09/21 1330  TempSrc:   PainSc: 0-No pain                 Nolon Nations

## 2021-03-09 NOTE — Discharge Instructions (Addendum)

## 2021-03-09 NOTE — Transfer of Care (Signed)
Immediate Anesthesia Transfer of Care Note  Patient: Joan White  Procedure(s) Performed: TRANSVAGINAL TAPE (TVT) PROCEDURE (Vagina ) CYSTOSCOPY (Bladder)  Patient Location: PACU  Anesthesia Type:General  Level of Consciousness: awake, alert  and oriented  Airway & Oxygen Therapy: Patient Spontanous Breathing and Patient connected to face mask oxygen  Post-op Assessment: Report given to RN and Post -op Vital signs reviewed and stable  Post vital signs: Reviewed and stable  Last Vitals:  Vitals Value Taken Time  BP 144/70 03/09/21 1304  Temp    Pulse 95 03/09/21 1306  Resp 13 03/09/21 1306  SpO2 100 % 03/09/21 1306  Vitals shown include unvalidated device data.  Last Pain:  Vitals:   03/09/21 1009  TempSrc: Oral  PainSc: 0-No pain      Patients Stated Pain Goal: 3 (03/09/21 1009)  Complications: No notable events documented.

## 2021-03-10 ENCOUNTER — Encounter (HOSPITAL_BASED_OUTPATIENT_CLINIC_OR_DEPARTMENT_OTHER): Payer: Self-pay | Admitting: Obstetrics and Gynecology

## 2021-03-12 NOTE — Telephone Encounter (Signed)
Post- Op Call  Joan White underwent midurethral sling and cystoscopy  on 03/09/2021 with Dr Florian Buff. The patient reports that her pain is controlled. She was taking Advil and Tylenol and has not taken any pain medication since yesterday. She denies vaginal bleeding, only had minimal bleeding the first day. She has had a bowel movement and is taking Metamucil for a bowel regimen. She said she takes this everyday even w/o the surgery. She was discharged without a catheter and will return on 04/20/21 @ 1pm for a voiding trial.   Salina April, CMA

## 2021-03-16 ENCOUNTER — Encounter: Payer: Self-pay | Admitting: *Deleted

## 2021-03-24 DIAGNOSIS — Z1231 Encounter for screening mammogram for malignant neoplasm of breast: Secondary | ICD-10-CM | POA: Diagnosis not present

## 2021-04-03 DIAGNOSIS — R928 Other abnormal and inconclusive findings on diagnostic imaging of breast: Secondary | ICD-10-CM | POA: Diagnosis not present

## 2021-04-07 ENCOUNTER — Encounter (HOSPITAL_BASED_OUTPATIENT_CLINIC_OR_DEPARTMENT_OTHER): Payer: Self-pay | Admitting: Obstetrics & Gynecology

## 2021-04-07 NOTE — Progress Notes (Signed)
Entered in Error

## 2021-04-09 ENCOUNTER — Telehealth: Payer: Self-pay | Admitting: Obstetrics and Gynecology

## 2021-04-10 NOTE — Telephone Encounter (Signed)
Spoke to the patient 04/09/21. Pt is going to contact the billing dept for assistance ?

## 2021-04-15 ENCOUNTER — Encounter (HOSPITAL_BASED_OUTPATIENT_CLINIC_OR_DEPARTMENT_OTHER): Payer: Self-pay | Admitting: Obstetrics & Gynecology

## 2021-04-20 ENCOUNTER — Encounter: Payer: Self-pay | Admitting: Obstetrics and Gynecology

## 2021-04-20 ENCOUNTER — Other Ambulatory Visit: Payer: Self-pay

## 2021-04-20 ENCOUNTER — Ambulatory Visit (INDEPENDENT_AMBULATORY_CARE_PROVIDER_SITE_OTHER): Payer: Medicare PPO | Admitting: Obstetrics and Gynecology

## 2021-04-20 VITALS — BP 132/84 | HR 68

## 2021-04-20 DIAGNOSIS — Z9889 Other specified postprocedural states: Secondary | ICD-10-CM

## 2021-04-20 NOTE — Progress Notes (Signed)
 Urogynecology ? ?Date of Visit: 04/20/2021 ? ?History of Present Illness: Joan White is a 66 y.o. female scheduled today for a post-operative visit.  ?? Surgery: s/p Exam under anesthesia, midurethral sling ([employer]age Fit), cystoscopy on 1/30.23 ?? She passed her postoperative void trial.  ?? Postoperative course has been uncomplicated.  ? ?Today she reports she has been doing really well. No longer has to wear pads, completely dry. Has been  ? ?UTI in the last 6 weeks? No  ?Pain? No  ?She has returned to her normal activity (except for postop restrictions) ?Vaginal bulge? No  ?Stress incontinence: No  ?Urgency/frequency: No  ?Urge incontinence: No  ?Voiding dysfunction: No  ?Bowel issues: No  ? ? ?Medications: She has a current medication list which includes the following prescription(s): calcium carb-cholecalciferol, vitamin d, duloxetine, duloxetine, estradiol, medroxyprogesterone, multiple vitamin, psyllium, valacyclovir, and xiidra.  ? ?Allergies: Patient is allergic to sulfa antibiotics.  ? ?Physical Exam: ?BP 132/84   Pulse 68   ?Abdomen: soft, non-tender, without masses or organomegaly ?Suprapubic Incisions: healing well.  ?Pelvic Examination: Vagina: Incisions healing well. Sutures are not present at incision line . No tenderness. No pelvic masses. No visible or palpable mesh. ? ?POP-Q: ?deferred ?--------------------------------------------------------- ? ?Assessment and Plan:  ?1. Post-operative state   ? ? ?- SUI resolved ?- Can resume regular activity including exercise and intercourse,  if desired.  ? ?Follow up as needed ? ?Marguerita Beards, MD ? ? ?

## 2021-05-14 ENCOUNTER — Encounter (HOSPITAL_BASED_OUTPATIENT_CLINIC_OR_DEPARTMENT_OTHER): Payer: Self-pay | Admitting: *Deleted

## 2021-05-29 DIAGNOSIS — R7301 Impaired fasting glucose: Secondary | ICD-10-CM | POA: Diagnosis not present

## 2021-05-29 DIAGNOSIS — R7989 Other specified abnormal findings of blood chemistry: Secondary | ICD-10-CM | POA: Diagnosis not present

## 2021-05-29 DIAGNOSIS — Z79899 Other long term (current) drug therapy: Secondary | ICD-10-CM | POA: Diagnosis not present

## 2021-05-29 DIAGNOSIS — M859 Disorder of bone density and structure, unspecified: Secondary | ICD-10-CM | POA: Diagnosis not present

## 2021-06-05 DIAGNOSIS — M170 Bilateral primary osteoarthritis of knee: Secondary | ICD-10-CM | POA: Diagnosis not present

## 2021-06-05 DIAGNOSIS — M5136 Other intervertebral disc degeneration, lumbar region: Secondary | ICD-10-CM | POA: Diagnosis not present

## 2021-06-05 DIAGNOSIS — F418 Other specified anxiety disorders: Secondary | ICD-10-CM | POA: Diagnosis not present

## 2021-06-05 DIAGNOSIS — Z1331 Encounter for screening for depression: Secondary | ICD-10-CM | POA: Diagnosis not present

## 2021-06-05 DIAGNOSIS — R7301 Impaired fasting glucose: Secondary | ICD-10-CM | POA: Diagnosis not present

## 2021-06-05 DIAGNOSIS — R82998 Other abnormal findings in urine: Secondary | ICD-10-CM | POA: Diagnosis not present

## 2021-06-05 DIAGNOSIS — Z1339 Encounter for screening examination for other mental health and behavioral disorders: Secondary | ICD-10-CM | POA: Diagnosis not present

## 2021-06-05 DIAGNOSIS — M8580 Other specified disorders of bone density and structure, unspecified site: Secondary | ICD-10-CM | POA: Diagnosis not present

## 2021-06-05 DIAGNOSIS — Z0000 Encounter for general adult medical examination without abnormal findings: Secondary | ICD-10-CM | POA: Diagnosis not present

## 2021-06-05 DIAGNOSIS — K589 Irritable bowel syndrome without diarrhea: Secondary | ICD-10-CM | POA: Diagnosis not present

## 2021-06-16 ENCOUNTER — Other Ambulatory Visit (HOSPITAL_BASED_OUTPATIENT_CLINIC_OR_DEPARTMENT_OTHER): Payer: Self-pay | Admitting: Obstetrics & Gynecology

## 2021-06-16 DIAGNOSIS — Z79890 Hormone replacement therapy (postmenopausal): Secondary | ICD-10-CM

## 2021-07-01 ENCOUNTER — Other Ambulatory Visit: Payer: Self-pay | Admitting: Internal Medicine

## 2021-07-01 DIAGNOSIS — M5136 Other intervertebral disc degeneration, lumbar region: Secondary | ICD-10-CM

## 2021-07-02 ENCOUNTER — Other Ambulatory Visit: Payer: Self-pay | Admitting: Internal Medicine

## 2021-07-02 DIAGNOSIS — M5136 Other intervertebral disc degeneration, lumbar region: Secondary | ICD-10-CM

## 2021-07-07 ENCOUNTER — Other Ambulatory Visit: Payer: Self-pay | Admitting: Internal Medicine

## 2021-07-07 DIAGNOSIS — M5136 Other intervertebral disc degeneration, lumbar region: Secondary | ICD-10-CM

## 2021-07-27 ENCOUNTER — Other Ambulatory Visit: Payer: Medicare PPO

## 2021-08-03 ENCOUNTER — Ambulatory Visit
Admission: RE | Admit: 2021-08-03 | Discharge: 2021-08-03 | Disposition: A | Discharge status: Home / Self Care | Payer: Medicare PPO | Source: Ambulatory Visit | Attending: Internal Medicine | Admitting: Internal Medicine

## 2021-08-03 DIAGNOSIS — M545 Low back pain, unspecified: Secondary | ICD-10-CM | POA: Diagnosis not present

## 2021-08-03 DIAGNOSIS — M25552 Pain in left hip: Secondary | ICD-10-CM | POA: Diagnosis not present

## 2021-08-03 DIAGNOSIS — M5136 Other intervertebral disc degeneration, lumbar region: Secondary | ICD-10-CM

## 2021-09-21 NOTE — Progress Notes (Unsigned)
PATIENT: Joan White DOB: 1955-08-15  REASON FOR VISIT: follow up HISTORY FROM: patient PRIMARY NEUROLOGIST: Dr. Vickey Huger  HISTORY OF PRESENT ILLNESS: Today 09/22/21: Joan White is a 66 year old female with a history of obstructive sleep apnea on CPAP.  She returns today for follow-up.  She report the CPAP works well for her.  She does notice the benefit.  She decided that she did not want to proceed with any further testing in regards to narcolepsy.  Her download is below    09/17/20: Joan White is a 66 year old female with a history of obstructive sleep apnea on CPAP.  She returns today for follow-up.  Her download indicates that she use her machine 29 out of 30 days for compliance of 97%.  She is averaging greater than 4 hours 28 days for compliance of 93%.  On average she uses her machine 8 hours and 59 minutes.  Her residual AHI is 3.6 on 5 to 15 cm of water with EPR 2.  Leak in the 95th percentile is 19.7 L/min. Reports that she is still sleepy. Has to take frequent naps. Can't make it through a movie. States her PCP has completed a workup but did not find any reason for her sleepiness.   HISTORY (Copied from Dr.Dohmeier's note) Joan White states that she does not feel she is sleeping that well recently she is a side sleeper the mask sometimes Gets dislodged. Her husband is using CPAP but he sleeps on his back and is doing much better with the air seal. She has been followed in our sleep clinic for years now and overall she feels that sleep has been better on CPAP.it is still sometimes bothersome to have to put the mask on-  she has been a 97% compliant CPAP user with an average of 9 hours and 23 minutes, she is using the AutoSet between settings of minimum pressure 7 cmH2O maximum pressure is 13 cmH2O and 3 cm EPR 95th percentile pressure is 11.6 which tells me that she is struggling with the upper limit and we should increase her pressure of 4 to 15 cm of water. Air leaks a  moderate the residual AHI is 5.0 and subsequent consists of obstructive sleep apnea I think that an increase in pressure is actually needed. She is using a nasal pillow interface.   She has felt depressed and has taken Cymbalta, had also a positive effect on her arthritic pain. She has dull- daytime headaches, sometimes waking up with these, have been present her whole life. No migraines at this time. Pressure most behind the eyes. Like to treat these with peppermint oil.    Today's fatigue severity score was endorsed at 28 points her Epworth sleepiness score at 12 /24 points and her depression score which has affected everybody during the pandemic somewhat has been endorsed at 3/ 15 points.    REVIEW OF SYSTEMS: Out of a complete 14 system review of symptoms, the patient complains only of the following symptoms, and all other reviewed systems are negative.  ESS 8  ALLERGIES: Allergies  Allergen Reactions   Sulfa Antibiotics Nausea And Vomiting    HOME MEDICATIONS: Outpatient Medications Prior to Visit  Medication Sig Dispense Refill   buPROPion (WELLBUTRIN XL) 150 MG 24 hr tablet Take 150 mg by mouth every morning.     Cholecalciferol (VITAMIN D PO) Take 1,000 Int'l Units by mouth.     DULoxetine (CYMBALTA) 30 MG capsule Take 30 mg by mouth daily. Take in  addition to the 60mg  capsule.     DULoxetine (CYMBALTA) 60 MG capsule Take 1 capsule (60 mg total) by mouth daily. 90 capsule 4   estradiol (ESTRACE) 0.5 MG tablet TAKE 1 TABLET BY MOUTH ONCE (1) DAILY (Patient taking differently: 0.25 mg. TAKE 1 TABLET BY MOUTH ONCE (1) DAILY @ 1100 am) 90 tablet 3   medroxyPROGESTERone (PROVERA) 2.5 MG tablet TAKE 1 TABLET DAILY DAYS 1-20 EACH MONTH. 60 tablet 3   Multiple Vitamins-Minerals (MULTIVITAMIN PO) Take by mouth daily.     Probiotic Product (PHILLIPS COLON HEALTH PO) Take 1 tablet by mouth daily.     traMADol (ULTRAM) 50 MG tablet Take 50 mg by mouth every 4 (four) hours as needed.      valACYclovir (VALTREX) 1000 MG tablet Take 1 tablet (1,000 mg total) by mouth daily. Take as directed. (Patient taking differently: Take 500 mg by mouth daily. Take as directed. 500 mg @ 1100 am daily) 90 tablet 3   XIIDRA 5 % SOLN 2 (two) times daily.     Calcium Carb-Cholecalciferol 600-10 MG-MCG TABS Take by mouth daily at 6 (six) AM.     psyllium (METAMUCIL) 58.6 % powder Take 1 packet by mouth daily. 2 tablespoons     No facility-administered medications prior to visit.    PAST MEDICAL HISTORY: Past Medical History:  Diagnosis Date   Abnormal Pap smear    h/o cryo in her 87's   Arthritis    oa   Constipation    Depression    situational no meds taken   Dry eyes    Genital herpes    History of COVID-19 2021   fatigie x 5 to 6 days all symptoms resolved   History of migraine headaches    HSV infection    left leg aches @ times    Sleep apnea    SUI (stress urinary incontinence, female)    pads prn   Wears glasses     PAST SURGICAL HISTORY: Past Surgical History:  Procedure Laterality Date   BLADDER SUSPENSION N/A 03/09/2021   Procedure: TRANSVAGINAL TAPE (TVT) PROCEDURE;  Surgeon: Marguerita Beards, MD;  Location: [facility];  Service: Gynecology;  Laterality: N/A;   CATARACT EXTRACTION, BILATERAL  2019   CESAREAN SECTION     1982 and 1985   colonscopy     last done 2017   CYSTOSCOPY N/A 03/09/2021   Procedure: CYSTOSCOPY;  Surgeon: Marguerita Beards, MD;  Location: [facility];  Service: Gynecology;  Laterality: N/A;   GYNECOLOGIC CRYOSURGERY     yrs ago   NASAL SEPTUM SURGERY     age 50   SHOULDER SURGERY Right 06/2016   Dr. Sherlean Foot arthroscopic for bone spurs   TUBAL LIGATION  1989    FAMILY HISTORY: Family History  Problem Relation Age of Onset   Heart disease Mother    Hypertension Mother    Ulcerative colitis Father    Colon cancer Paternal Grandmother     SOCIAL HISTORY: Social History   Socioeconomic History    Marital status: Married    Spouse name: Not on file   Number of children: Not on file   Years of education: Not on file   Highest education level: Not on file  Occupational History   Not on file  Tobacco Use   Smoking status: Never   Smokeless tobacco: Never  Vaping Use   Vaping Use: Never used  Substance and Sexual Activity   Alcohol  use: Yes    Alcohol/week: 2.0 standard drinks of alcohol    Types: 2 Glasses of wine per week    Comment: gin and tonic 1 to 2 per night   Drug use: No   Sexual activity: Yes    Partners: Male    Birth control/protection: Surgical, Post-menopausal  Other Topics Concern   Not on file  Social History Narrative   Not on file   Social Determinants of Health   Financial Resource Strain: Not on file  Food Insecurity: Not on file  Transportation Needs: Not on file  Physical Activity: Not on file  Stress: Not on file  Social Connections: Not on file  Intimate Partner Violence: Not on file      PHYSICAL EXAM  Vitals:   09/22/21 1048  BP: 139/76  Pulse: 93  Weight: 146 lb 12.8 oz (66.6 kg)  Height: 5' (1.524 m)   Body mass index is 28.67 kg/m.  Generalized: Well developed, in no acute distress  Chest: Lungs clear to auscultation bilaterally  Neurological examination  Mentation: Alert oriented to time, place, history taking. Follows all commands speech and language fluent Cranial nerve II-XII: Extraocular movements were full, visual field were full on confrontational test Head turning and shoulder shrug  were normal and symmetric.  Neck circumference 13 inches, Mallampati 3+ Gait and station: Gait is normal.    DIAGNOSTIC DATA (LABS, IMAGING, TESTING) - I reviewed patient records, labs, notes, testing and imaging myself where available.  Lab Results  Component Value Date   HGB 11.8 (L) 11/16/2013      Component Value Date/Time   NA 135 11/16/2013 1520   K 3.8 11/16/2013 1520   CL 97 11/16/2013 1520   CO2 28 11/16/2013 1520    GLUCOSE 86 11/16/2013 1520   BUN 14 11/16/2013 1520   CREATININE 0.70 11/16/2013 1520   CALCIUM 9.8 11/16/2013 1520   PROT 7.4 11/16/2013 1520   ALBUMIN 4.5 11/16/2013 1520   AST 21 11/16/2013 1520   ALT 16 11/16/2013 1520   ALKPHOS 75 11/16/2013 1520   BILITOT 0.3 11/16/2013 1520   Lab Results  Component Value Date   CHOL 132 11/16/2013   HDL 69 11/16/2013   LDLCALC 41 11/16/2013   TRIG 109 11/16/2013   CHOLHDL 1.9 11/16/2013   No results found for: "HGBA1C" No results found for: "VITAMINB12" Lab Results  Component Value Date   TSH 1.379 11/16/2013      ASSESSMENT AND PLAN 67 y.o. year old female  has a past medical history of Abnormal Pap smear, Arthritis, Constipation, Depression, Dry eyes, Genital herpes, History of COVID-19 (2021), History of migraine headaches, HSV infection, left leg aches @ times, Sleep apnea, SUI (stress urinary incontinence, female), and Wears glasses. here with:  OSA on CPAP  - CPAP compliance excellent - Good treatment of AHI  - Encourage patient to use CPAP nightly and > 4 hours each night  2. Excessive daytime sleepiness  - OSA well treated -Does not want to do any further testing in regards to narcolepsy  - F/U in 1 year or sooner if needed    Butch Penny, MSN, NP-C 09/22/2021, 11:12 AM [facility] [address], [address] [place], [place] [address] [phone]

## 2021-09-22 ENCOUNTER — Ambulatory Visit: Payer: Medicare PPO | Admitting: Adult Health

## 2021-09-22 ENCOUNTER — Encounter: Payer: Self-pay | Admitting: Adult Health

## 2021-09-22 VITALS — BP 139/76 | HR 93 | Ht 60.0 in | Wt 146.8 lb

## 2021-09-22 DIAGNOSIS — G4733 Obstructive sleep apnea (adult) (pediatric): Secondary | ICD-10-CM | POA: Diagnosis not present

## 2021-09-22 DIAGNOSIS — Z9989 Dependence on other enabling machines and devices: Secondary | ICD-10-CM

## 2021-09-22 NOTE — Patient Instructions (Signed)
Continue using CPAP nightly and greater than 4 hours each night °If your symptoms worsen or you develop new symptoms please let us know.  ° °

## 2021-10-05 ENCOUNTER — Other Ambulatory Visit (HOSPITAL_BASED_OUTPATIENT_CLINIC_OR_DEPARTMENT_OTHER): Payer: Self-pay | Admitting: *Deleted

## 2021-10-05 MED ORDER — ESTRADIOL 0.5 MG PO TABS: ORAL_TABLET | ORAL | 0 refills | Status: DC

## 2021-10-05 NOTE — Progress Notes (Signed)
Pt called requesting refill on estradiol tablets. Rx sent to pharmacy

## 2021-10-22 DIAGNOSIS — M25552 Pain in left hip: Secondary | ICD-10-CM | POA: Diagnosis not present

## 2021-10-22 DIAGNOSIS — M7062 Trochanteric bursitis, left hip: Secondary | ICD-10-CM | POA: Diagnosis not present

## 2021-10-26 DIAGNOSIS — M5459 Other low back pain: Secondary | ICD-10-CM | POA: Diagnosis not present

## 2021-10-26 DIAGNOSIS — M25552 Pain in left hip: Secondary | ICD-10-CM | POA: Diagnosis not present

## 2021-10-26 DIAGNOSIS — M625A2 Muscle wasting and atrophy, not elsewhere classified, back, lumbosacral: Secondary | ICD-10-CM | POA: Diagnosis not present

## 2021-10-26 DIAGNOSIS — M62552 Muscle wasting and atrophy, not elsewhere classified, left thigh: Secondary | ICD-10-CM | POA: Diagnosis not present

## 2021-10-30 DIAGNOSIS — M62552 Muscle wasting and atrophy, not elsewhere classified, left thigh: Secondary | ICD-10-CM | POA: Diagnosis not present

## 2021-10-30 DIAGNOSIS — M625A2 Muscle wasting and atrophy, not elsewhere classified, back, lumbosacral: Secondary | ICD-10-CM | POA: Diagnosis not present

## 2021-10-30 DIAGNOSIS — M5459 Other low back pain: Secondary | ICD-10-CM | POA: Diagnosis not present

## 2021-10-30 DIAGNOSIS — M25552 Pain in left hip: Secondary | ICD-10-CM | POA: Diagnosis not present

## 2021-11-03 DIAGNOSIS — M25552 Pain in left hip: Secondary | ICD-10-CM | POA: Diagnosis not present

## 2021-11-03 DIAGNOSIS — M62552 Muscle wasting and atrophy, not elsewhere classified, left thigh: Secondary | ICD-10-CM | POA: Diagnosis not present

## 2021-11-03 DIAGNOSIS — M5459 Other low back pain: Secondary | ICD-10-CM | POA: Diagnosis not present

## 2021-11-03 DIAGNOSIS — M625A2 Muscle wasting and atrophy, not elsewhere classified, back, lumbosacral: Secondary | ICD-10-CM | POA: Diagnosis not present

## 2021-11-06 DIAGNOSIS — M25552 Pain in left hip: Secondary | ICD-10-CM | POA: Diagnosis not present

## 2021-11-06 DIAGNOSIS — M625A2 Muscle wasting and atrophy, not elsewhere classified, back, lumbosacral: Secondary | ICD-10-CM | POA: Diagnosis not present

## 2021-11-06 DIAGNOSIS — M5459 Other low back pain: Secondary | ICD-10-CM | POA: Diagnosis not present

## 2021-11-06 DIAGNOSIS — M62552 Muscle wasting and atrophy, not elsewhere classified, left thigh: Secondary | ICD-10-CM | POA: Diagnosis not present

## 2021-11-09 DIAGNOSIS — M62552 Muscle wasting and atrophy, not elsewhere classified, left thigh: Secondary | ICD-10-CM | POA: Diagnosis not present

## 2021-11-09 DIAGNOSIS — M25552 Pain in left hip: Secondary | ICD-10-CM | POA: Diagnosis not present

## 2021-11-09 DIAGNOSIS — M5459 Other low back pain: Secondary | ICD-10-CM | POA: Diagnosis not present

## 2021-11-09 DIAGNOSIS — M625A2 Muscle wasting and atrophy, not elsewhere classified, back, lumbosacral: Secondary | ICD-10-CM | POA: Diagnosis not present

## 2021-11-12 DIAGNOSIS — M5459 Other low back pain: Secondary | ICD-10-CM | POA: Diagnosis not present

## 2021-11-12 DIAGNOSIS — M25552 Pain in left hip: Secondary | ICD-10-CM | POA: Diagnosis not present

## 2021-11-12 DIAGNOSIS — M62552 Muscle wasting and atrophy, not elsewhere classified, left thigh: Secondary | ICD-10-CM | POA: Diagnosis not present

## 2021-11-12 DIAGNOSIS — M625A2 Muscle wasting and atrophy, not elsewhere classified, back, lumbosacral: Secondary | ICD-10-CM | POA: Diagnosis not present

## 2021-11-14 ENCOUNTER — Other Ambulatory Visit (HOSPITAL_BASED_OUTPATIENT_CLINIC_OR_DEPARTMENT_OTHER): Payer: Self-pay | Admitting: Obstetrics & Gynecology

## 2021-11-16 DIAGNOSIS — M625A2 Muscle wasting and atrophy, not elsewhere classified, back, lumbosacral: Secondary | ICD-10-CM | POA: Diagnosis not present

## 2021-11-16 DIAGNOSIS — M25552 Pain in left hip: Secondary | ICD-10-CM | POA: Diagnosis not present

## 2021-11-16 DIAGNOSIS — M5459 Other low back pain: Secondary | ICD-10-CM | POA: Diagnosis not present

## 2021-11-16 DIAGNOSIS — M62552 Muscle wasting and atrophy, not elsewhere classified, left thigh: Secondary | ICD-10-CM | POA: Diagnosis not present

## 2021-11-19 DIAGNOSIS — M25552 Pain in left hip: Secondary | ICD-10-CM | POA: Diagnosis not present

## 2021-11-19 DIAGNOSIS — M62552 Muscle wasting and atrophy, not elsewhere classified, left thigh: Secondary | ICD-10-CM | POA: Diagnosis not present

## 2021-11-19 DIAGNOSIS — M5459 Other low back pain: Secondary | ICD-10-CM | POA: Diagnosis not present

## 2021-11-19 DIAGNOSIS — M625A2 Muscle wasting and atrophy, not elsewhere classified, back, lumbosacral: Secondary | ICD-10-CM | POA: Diagnosis not present

## 2021-11-24 DIAGNOSIS — J069 Acute upper respiratory infection, unspecified: Secondary | ICD-10-CM | POA: Diagnosis not present

## 2021-11-24 DIAGNOSIS — R059 Cough, unspecified: Secondary | ICD-10-CM | POA: Diagnosis not present

## 2021-11-24 DIAGNOSIS — R5383 Other fatigue: Secondary | ICD-10-CM | POA: Diagnosis not present

## 2021-11-24 DIAGNOSIS — Z1152 Encounter for screening for COVID-19: Secondary | ICD-10-CM | POA: Diagnosis not present

## 2021-11-24 DIAGNOSIS — R638 Other symptoms and signs concerning food and fluid intake: Secondary | ICD-10-CM | POA: Diagnosis not present

## 2021-11-24 DIAGNOSIS — R0981 Nasal congestion: Secondary | ICD-10-CM | POA: Diagnosis not present

## 2021-11-24 DIAGNOSIS — J029 Acute pharyngitis, unspecified: Secondary | ICD-10-CM | POA: Diagnosis not present

## 2021-11-26 DIAGNOSIS — M625A2 Muscle wasting and atrophy, not elsewhere classified, back, lumbosacral: Secondary | ICD-10-CM | POA: Diagnosis not present

## 2021-11-26 DIAGNOSIS — M5459 Other low back pain: Secondary | ICD-10-CM | POA: Diagnosis not present

## 2021-11-26 DIAGNOSIS — M25552 Pain in left hip: Secondary | ICD-10-CM | POA: Diagnosis not present

## 2021-11-26 DIAGNOSIS — M62552 Muscle wasting and atrophy, not elsewhere classified, left thigh: Secondary | ICD-10-CM | POA: Diagnosis not present

## 2021-11-30 ENCOUNTER — Encounter: Payer: Self-pay | Admitting: *Deleted

## 2021-12-17 DIAGNOSIS — Z6828 Body mass index (BMI) 28.0-28.9, adult: Secondary | ICD-10-CM | POA: Diagnosis not present

## 2021-12-17 DIAGNOSIS — M5136 Other intervertebral disc degeneration, lumbar region: Secondary | ICD-10-CM | POA: Diagnosis not present

## 2021-12-17 DIAGNOSIS — M7062 Trochanteric bursitis, left hip: Secondary | ICD-10-CM | POA: Diagnosis not present

## 2022-01-14 DIAGNOSIS — M7062 Trochanteric bursitis, left hip: Secondary | ICD-10-CM | POA: Diagnosis not present

## 2022-01-14 DIAGNOSIS — Z6828 Body mass index (BMI) 28.0-28.9, adult: Secondary | ICD-10-CM | POA: Diagnosis not present

## 2022-02-22 DIAGNOSIS — Z961 Presence of intraocular lens: Secondary | ICD-10-CM | POA: Diagnosis not present

## 2022-02-22 DIAGNOSIS — H524 Presbyopia: Secondary | ICD-10-CM | POA: Diagnosis not present

## 2022-02-22 DIAGNOSIS — H04123 Dry eye syndrome of bilateral lacrimal glands: Secondary | ICD-10-CM | POA: Diagnosis not present

## 2022-02-22 DIAGNOSIS — H18593 Other hereditary corneal dystrophies, bilateral: Secondary | ICD-10-CM | POA: Diagnosis not present

## 2022-02-22 DIAGNOSIS — H11002 Unspecified pterygium of left eye: Secondary | ICD-10-CM | POA: Diagnosis not present

## 2022-03-30 DIAGNOSIS — Z1231 Encounter for screening mammogram for malignant neoplasm of breast: Secondary | ICD-10-CM | POA: Diagnosis not present

## 2022-04-01 DIAGNOSIS — M7062 Trochanteric bursitis, left hip: Secondary | ICD-10-CM | POA: Diagnosis not present

## 2022-04-01 DIAGNOSIS — Z6826 Body mass index (BMI) 26.0-26.9, adult: Secondary | ICD-10-CM | POA: Diagnosis not present

## 2022-05-29 DIAGNOSIS — G4733 Obstructive sleep apnea (adult) (pediatric): Secondary | ICD-10-CM | POA: Diagnosis not present

## 2022-06-07 DIAGNOSIS — J020 Streptococcal pharyngitis: Secondary | ICD-10-CM | POA: Diagnosis not present

## 2022-06-07 DIAGNOSIS — J029 Acute pharyngitis, unspecified: Secondary | ICD-10-CM | POA: Diagnosis not present

## 2022-06-18 DIAGNOSIS — E875 Hyperkalemia: Secondary | ICD-10-CM | POA: Diagnosis not present

## 2022-06-18 DIAGNOSIS — R7301 Impaired fasting glucose: Secondary | ICD-10-CM | POA: Diagnosis not present

## 2022-06-18 DIAGNOSIS — R7989 Other specified abnormal findings of blood chemistry: Secondary | ICD-10-CM | POA: Diagnosis not present

## 2022-06-18 DIAGNOSIS — M859 Disorder of bone density and structure, unspecified: Secondary | ICD-10-CM | POA: Diagnosis not present

## 2022-07-01 ENCOUNTER — Other Ambulatory Visit (HOSPITAL_BASED_OUTPATIENT_CLINIC_OR_DEPARTMENT_OTHER): Payer: Self-pay | Admitting: Obstetrics & Gynecology

## 2022-07-01 DIAGNOSIS — Z0000 Encounter for general adult medical examination without abnormal findings: Secondary | ICD-10-CM | POA: Diagnosis not present

## 2022-07-01 DIAGNOSIS — R82998 Other abnormal findings in urine: Secondary | ICD-10-CM | POA: Diagnosis not present

## 2022-07-01 DIAGNOSIS — G4733 Obstructive sleep apnea (adult) (pediatric): Secondary | ICD-10-CM | POA: Diagnosis not present

## 2022-07-01 DIAGNOSIS — M5136 Other intervertebral disc degeneration, lumbar region: Secondary | ICD-10-CM | POA: Diagnosis not present

## 2022-07-01 DIAGNOSIS — Z79890 Hormone replacement therapy (postmenopausal): Secondary | ICD-10-CM

## 2022-07-01 DIAGNOSIS — D692 Other nonthrombocytopenic purpura: Secondary | ICD-10-CM | POA: Diagnosis not present

## 2022-07-01 DIAGNOSIS — L989 Disorder of the skin and subcutaneous tissue, unspecified: Secondary | ICD-10-CM | POA: Diagnosis not present

## 2022-07-01 DIAGNOSIS — M4302 Spondylolysis, cervical region: Secondary | ICD-10-CM | POA: Diagnosis not present

## 2022-07-01 DIAGNOSIS — Z23 Encounter for immunization: Secondary | ICD-10-CM | POA: Diagnosis not present

## 2022-07-01 DIAGNOSIS — R519 Headache, unspecified: Secondary | ICD-10-CM | POA: Diagnosis not present

## 2022-07-01 DIAGNOSIS — F418 Other specified anxiety disorders: Secondary | ICD-10-CM | POA: Diagnosis not present

## 2022-07-01 DIAGNOSIS — R413 Other amnesia: Secondary | ICD-10-CM | POA: Diagnosis not present

## 2022-07-02 NOTE — Telephone Encounter (Signed)
Attempted to call pt - unable to leave message 

## 2022-07-06 DIAGNOSIS — Z1212 Encounter for screening for malignant neoplasm of rectum: Secondary | ICD-10-CM | POA: Diagnosis not present

## 2022-07-28 ENCOUNTER — Other Ambulatory Visit (HOSPITAL_BASED_OUTPATIENT_CLINIC_OR_DEPARTMENT_OTHER): Payer: Self-pay | Admitting: Obstetrics & Gynecology

## 2022-07-28 DIAGNOSIS — Z79899 Other long term (current) drug therapy: Secondary | ICD-10-CM | POA: Diagnosis not present

## 2022-07-28 DIAGNOSIS — D485 Neoplasm of uncertain behavior of skin: Secondary | ICD-10-CM | POA: Diagnosis not present

## 2022-07-28 DIAGNOSIS — C44219 Basal cell carcinoma of skin of left ear and external auricular canal: Secondary | ICD-10-CM | POA: Diagnosis not present

## 2022-07-28 DIAGNOSIS — Z0000 Encounter for general adult medical examination without abnormal findings: Secondary | ICD-10-CM | POA: Diagnosis not present

## 2022-07-28 DIAGNOSIS — M25552 Pain in left hip: Secondary | ICD-10-CM | POA: Diagnosis not present

## 2022-07-28 DIAGNOSIS — D649 Anemia, unspecified: Secondary | ICD-10-CM | POA: Diagnosis not present

## 2022-09-01 ENCOUNTER — Ambulatory Visit (HOSPITAL_BASED_OUTPATIENT_CLINIC_OR_DEPARTMENT_OTHER): Payer: Medicare PPO | Admitting: Obstetrics & Gynecology

## 2022-09-08 ENCOUNTER — Other Ambulatory Visit (HOSPITAL_COMMUNITY)
Admission: RE | Admit: 2022-09-08 | Discharge: 2022-09-08 | Disposition: A | Discharge status: Home / Self Care | Payer: Medicare PPO | Source: Ambulatory Visit | Attending: Student | Admitting: Student

## 2022-09-08 ENCOUNTER — Encounter (HOSPITAL_BASED_OUTPATIENT_CLINIC_OR_DEPARTMENT_OTHER): Payer: Self-pay | Admitting: Student

## 2022-09-08 ENCOUNTER — Ambulatory Visit (INDEPENDENT_AMBULATORY_CARE_PROVIDER_SITE_OTHER): Payer: Medicare PPO | Admitting: Student

## 2022-09-08 VITALS — BP 128/61 | HR 88 | Ht 60.0 in | Wt 139.2 lb

## 2022-09-08 DIAGNOSIS — Z01411 Encounter for gynecological examination (general) (routine) with abnormal findings: Secondary | ICD-10-CM | POA: Insufficient documentation

## 2022-09-08 DIAGNOSIS — Z124 Encounter for screening for malignant neoplasm of cervix: Secondary | ICD-10-CM

## 2022-09-08 DIAGNOSIS — Z1151 Encounter for screening for human papillomavirus (HPV): Secondary | ICD-10-CM | POA: Diagnosis not present

## 2022-09-08 DIAGNOSIS — C44219 Basal cell carcinoma of skin of left ear and external auricular canal: Secondary | ICD-10-CM | POA: Diagnosis not present

## 2022-09-08 DIAGNOSIS — L821 Other seborrheic keratosis: Secondary | ICD-10-CM | POA: Diagnosis not present

## 2022-09-08 DIAGNOSIS — N898 Other specified noninflammatory disorders of vagina: Secondary | ICD-10-CM | POA: Diagnosis not present

## 2022-09-08 DIAGNOSIS — Z01419 Encounter for gynecological examination (general) (routine) without abnormal findings: Secondary | ICD-10-CM

## 2022-09-08 MED ORDER — FLUCONAZOLE 150 MG PO TABS
150.0000 mg | ORAL_TABLET | Freq: Every day | ORAL | 1 refills | Status: DC
Start: 2022-09-08 — End: 2022-09-08

## 2022-09-08 MED ORDER — FLUCONAZOLE 150 MG PO TABS: 150.0000 mg | ORAL_TABLET | Freq: Every day | ORAL | 1 refills | Status: DC

## 2022-09-08 NOTE — Progress Notes (Signed)
ANNUAL EXAM Patient name: Joan White MRN [id number]  Date of birth: 1955-04-25 Chief Complaint:   Breast and Pelvic  History of Present Illness:   Joan White is a 67 y.o. G2P2 Caucasian female being seen today for a routine annual exam.  Current complaints: Concern for vaginal yeast infection. Treated with Vagisil and witch-hazel peripads without improvement. Has been scratching and noticed increased burning sensation.  No LMP recorded. Patient is postmenopausal.   The pregnancy intention screening data noted above was reviewed. Potential methods of contraception were discussed. The patient elected to proceed with No data recorded.   Last pap 11/2020. Results were: NILM w/ HRHPV not done. H/O abnormal pap: remote hx, h/o cryo to cervix  Last mammogram: 2024 - requesting records be sent to [facility]. Results were: normal. Family h/o breast cancer: no Last colonoscopy: 12/2015. Results were: normal. Family h/o colorectal cancer: yes paternal grandmother     09/08/2022    9:47 AM 11/27/2020   11:44 AM  Depression screen PHQ 2/9  Decreased Interest 0 0  Down, Depressed, Hopeless 0 0  PHQ - 2 Score 0 0         No data to display           Review of Systems:   Pertinent items are noted in HPI Denies any headaches, blurred vision, fatigue, shortness of breath, chest pain, abdominal pain, abnormal vaginal discharge/itching/odor/irritation, problems with periods, bowel movements, urination, or intercourse unless otherwise stated above. Pertinent History Reviewed:  Reviewed past medical,surgical, social and family history.  Reviewed problem list, medications and allergies. Physical Assessment:   Vitals:   09/08/22 0944  BP: 128/61  Pulse: 88  Weight: 139 lb 3.2 oz (63.1 kg)  Height: 5' (1.524 m)  Body mass index is 27.19 kg/m.        Physical Examination:   General appearance - well appearing, and in no distress  Mental status - alert, oriented to  person, place, and time  Psych:  She has a normal mood and affect  Skin - warm and dry, normal color, no suspicious lesions noted  Chest - effort normal, all lung fields clear to auscultation bilaterally  Heart - normal rate and regular rhythm  Neck:  midline trachea, no thyromegaly or nodules  Breasts - breasts appear normal, no suspicious masses, no skin or nipple changes or  axillary nodes  Abdomen - soft, nontender, nondistended, no masses or organomegaly  Pelvic - VULVA: normal appearing vulva with no masses, tenderness or lesions; diffuse erythematous tissue  VAGINA: normal appearing vagina with normal color and discharge, no lesions; white discharge noted  CERVIX: normal appearing cervix without discharge or lesions, no CMT; Atrophy noted  Thin prep pap is done with HR HPV cotesting  Extremities:  No swelling or varicosities noted  Chaperone present for exam  No results found for this or any previous visit (from the past 24 hour(s)).  Assessment & Plan:  1. Women's annual routine gynecological examination - Normal exam - Cytology - PAP( [place]) - Cervicovaginal ancillary only  2. Vaginal discharge - Presentation is similar to a yeast infection. Will prescribe one dose of Diflucan and await results to instruct patient on further management - Recommended vaginal hygiene discussed - Cervicovaginal ancillary only  3. Cervical cancer screening - Discussed ACOG recommendations for cervical cancer screening after age 40 years old. Patient will has had 4 negative cytology screenings within the past 10 years. Has had one HR HPV  testing done within the previous 4 years that was negative, but was not done at most recent screen. All have occurred within the recommended screening guidelines. Discussed potential option for patient to stop screening should today's cytology/hrHPV testing be negative.  - Cytology - PAP( [place])  Labs/procedures today:   Mammogram: in 1 year, or sooner  if problems Colonoscopy: per GI, in 2027, or sooner if problems  No orders of the defined types were placed in this encounter.   Meds:  Meds ordered this encounter  Medications   DISCONTD: fluconazole (DIFLUCAN) 150 MG tablet    Sig: Take 1 tablet (150 mg total) by mouth daily. If symptoms are not improved in 72 hours(3 days) please take second dose.    Dispense:  1 tablet    Refill:  1    Order Specific Question:   Supervising Provider    Answer:   Reva Bores [[id number]]   fluconazole (DIFLUCAN) 150 MG tablet    Sig: Take 1 tablet (150 mg total) by mouth daily. If symptoms are not improved in 72 hours(3 days) please take second dose.    Dispense:  1 tablet    Refill:  1    Order Specific Question:   Supervising Provider    Answer:   Reva Bores [[id number]]    Follow-up: No follow-ups on file.  Corlis Hove, NP 09/08/2022 10:45 AM

## 2022-09-16 ENCOUNTER — Encounter (HOSPITAL_BASED_OUTPATIENT_CLINIC_OR_DEPARTMENT_OTHER): Payer: Self-pay | Admitting: *Deleted

## 2022-09-27 ENCOUNTER — Encounter: Payer: Self-pay | Admitting: *Deleted

## 2022-09-28 ENCOUNTER — Telehealth: Payer: Self-pay | Admitting: Adult Health

## 2022-09-28 ENCOUNTER — Telehealth: Payer: Medicare PPO | Admitting: Adult Health

## 2022-09-28 NOTE — Telephone Encounter (Signed)
noted 

## 2022-09-28 NOTE — Telephone Encounter (Signed)
..   Pt understands that although there may be some limitations with this type of visit, we will take all precautions to reduce any security or privacy concerns.  Pt understands that this will be treated like an in office visit and we will file with pt's insurance, and there may be a patient responsible charge related to this service. ? ?

## 2022-10-08 ENCOUNTER — Telehealth: Payer: Medicare PPO | Admitting: Adult Health

## 2022-10-08 ENCOUNTER — Other Ambulatory Visit (HOSPITAL_BASED_OUTPATIENT_CLINIC_OR_DEPARTMENT_OTHER): Payer: Self-pay | Admitting: Obstetrics & Gynecology

## 2022-10-08 DIAGNOSIS — G4733 Obstructive sleep apnea (adult) (pediatric): Secondary | ICD-10-CM

## 2022-10-08 DIAGNOSIS — Z79890 Hormone replacement therapy (postmenopausal): Secondary | ICD-10-CM

## 2022-10-08 NOTE — Progress Notes (Signed)
PATIENT: Joan White DOB: 25-Sep-1955  REASON FOR VISIT: follow up HISTORY FROM: patient  Virtual Visit via Video Note  I connected with Joan White on 10/08/22 at 10:45 AM EDT by a video enabled telemedicine application located remotely at [facility] and verified that I am speaking with the correct person using two identifiers who was located at their own home.   I discussed the limitations of evaluation and management by telemedicine and the availability of in person appointments. The patient expressed understanding and agreed to proceed.   PATIENT: Joan White DOB: 05-13-1955  REASON FOR VISIT: follow up HISTORY FROM: patient  HISTORY OF PRESENT ILLNESS: Today 10/08/22:  Joan White is a 67 y.o. female with a history of OSA on CPAP. Returns today for follow-up.  Her download is below.  She reports that the CPAP is working well even though she does not particularly like using it every single night.  She denies any new issues.  Her download is below  HISTORY   REVIEW OF SYSTEMS: Out of a complete 14 system review of symptoms, the patient complains only of the following symptoms, and all other reviewed systems are negative.  ALLERGIES: Allergies  Allergen Reactions   Sulfa Antibiotics Nausea And Vomiting    HOME MEDICATIONS: Outpatient Medications Prior to Visit  Medication Sig Dispense Refill   buPROPion (WELLBUTRIN XL) 150 MG 24 hr tablet Take 150 mg by mouth every morning.     Cholecalciferol (VITAMIN D3) 50 MCG (2000 UT) capsule Take 2,000 Units by mouth daily.     DULoxetine (CYMBALTA) 30 MG capsule Take 30 mg by mouth daily. Take in addition to the 60mg  capsule.     DULoxetine (CYMBALTA) 60 MG capsule Take 1 capsule (60 mg total) by mouth daily. 90 capsule 4   estradiol (ESTRACE) 0.5 MG tablet TAKE 1 TABLET EVERY DAY 90 tablet 0   fluconazole (DIFLUCAN) 150 MG tablet Take 1 tablet (150 mg total) by mouth daily. If symptoms  are not improved in 72 hours(3 days) please take second dose. 1 tablet 1   Magnesium Hydroxide (MAGNESIA PO) Take 750 mg by mouth daily.     medroxyPROGESTERone (PROVERA) 2.5 MG tablet TAKE 1 TABLET DAILY ON DAYS 1-20 EACH MONTH. 60 tablet 0   Multiple Vitamins-Minerals (MULTIVITAMIN PO) Take by mouth daily.     Probiotic Product (PHILLIPS COLON HEALTH PO) Take 1 tablet by mouth daily.     telmisartan (MICARDIS) 80 MG tablet Take 80 mg by mouth daily.     traMADol (ULTRAM) 50 MG tablet Take 50 mg by mouth every 4 (four) hours as needed.     valACYclovir (VALTREX) 1000 MG tablet TAKE 1 TABLET (1,000 MG TOTAL) BY MOUTH DAILY AS DIRECTED. 90 tablet 0   XIIDRA 5 % SOLN 2 (two) times daily.     No facility-administered medications prior to visit.    PAST MEDICAL HISTORY: Past Medical History:  Diagnosis Date   Abnormal Pap smear    h/o cryo in her 29's   Arthritis    oa   Constipation    Depression    situational no meds taken   Dry eyes    Genital herpes    History of COVID-19 2021   fatigie x 5 to 6 days all symptoms resolved   History of migraine headaches    HSV infection    left leg aches @ times    Sleep apnea    SUI (  stress urinary incontinence, female)    pads prn   Wears glasses     PAST SURGICAL HISTORY: Past Surgical History:  Procedure Laterality Date   BLADDER SUSPENSION N/A 03/09/2021   Procedure: TRANSVAGINAL TAPE (TVT) PROCEDURE;  Surgeon: Marguerita Beards, MD;  Location: [facility];  Service: Gynecology;  Laterality: N/A;   CATARACT EXTRACTION, BILATERAL  2019   CESAREAN SECTION     1982 and 1985   colonscopy     last done 2017   CYSTOSCOPY N/A 03/09/2021   Procedure: CYSTOSCOPY;  Surgeon: Marguerita Beards, MD;  [facility];  Service: Gynecology;  Laterality: N/A;   GYNECOLOGIC CRYOSURGERY     yrs ago   NASAL SEPTUM SURGERY     age 7   SHOULDER SURGERY Right 06/2016   Dr. Sherlean Foot arthroscopic for bone  spurs   TUBAL LIGATION  1989    FAMILY HISTORY: Family History  Problem Relation Age of Onset   Heart disease Mother    Hypertension Mother    Ulcerative colitis Father    Colon cancer Paternal Grandmother     SOCIAL HISTORY: Social History   Socioeconomic History   Marital status: Married    Spouse name: Not on file   Number of children: Not on file   Years of education: Not on file   Highest education level: Not on file  Occupational History   Not on file  Tobacco Use   Smoking status: Never   Smokeless tobacco: Never  Vaping Use   Vaping status: Never Used  Substance and Sexual Activity   Alcohol use: Yes    Alcohol/week: 2.0 standard drinks of alcohol    Types: 2 Glasses of wine per week    Comment: gin and tonic 1 to 2 per night   Drug use: No   Sexual activity: Yes    Partners: Male    Birth control/protection: Surgical, Post-menopausal  Other Topics Concern   Not on file  Social History Narrative   Not on file   Social Determinants of Health   Financial Resource Strain: Not on file  Food Insecurity: Not on file  Transportation Needs: Not on file  Physical Activity: Not on file  Stress: Not on file  Social Connections: Unknown (06/22/2021)   Received from [facility]   Social Network    Social Network: Not on file  Intimate Partner Violence: Unknown (05/14/2021)   Received from [facility]   HITS    Physically Hurt: Not on file    Insult or Talk Down To: Not on file    Threaten Physical Harm: Not on file    Scream or Curse: Not on file      PHYSICAL EXAM Generalized: Well developed, in no acute distress   Neurological examination  Mentation: Alert oriented to time, place, history taking. Follows all commands speech and language fluent Cranial nerve II-XII: Facial symmetry noted  DIAGNOSTIC DATA (LABS, IMAGING, TESTING) - I reviewed patient records, labs, notes, testing and imaging myself where available.  Lab Results  Component Value  Date   HGB 11.8 (L) 11/16/2013      Component Value Date/Time   NA 135 11/16/2013 1520   K 3.8 11/16/2013 1520   CL 97 11/16/2013 1520   CO2 28 11/16/2013 1520   GLUCOSE 86 11/16/2013 1520   BUN 14 11/16/2013 1520   CREATININE 0.70 11/16/2013 1520   CALCIUM 9.8 11/16/2013 1520   PROT 7.4 11/16/2013 1520  ALBUMIN 4.5 11/16/2013 1520   AST 21 11/16/2013 1520   ALT 16 11/16/2013 1520   ALKPHOS 75 11/16/2013 1520   BILITOT 0.3 11/16/2013 1520   Lab Results  Component Value Date   CHOL 132 11/16/2013   HDL 69 11/16/2013   LDLCALC 41 11/16/2013   TRIG 109 11/16/2013   CHOLHDL 1.9 11/16/2013   No results found for: "HGBA1C" No results found for: "VITAMINB12" Lab Results  Component Value Date   TSH 1.379 11/16/2013      ASSESSMENT AND PLAN 67 y.o. year old female  has a past medical history of Abnormal Pap smear, Arthritis, Constipation, Depression, Dry eyes, Genital herpes, History of COVID-19 (2021), History of migraine headaches, HSV infection, left leg aches @ times, Sleep apnea, SUI (stress urinary incontinence, female), and Wears glasses. here with:  OSA on CPAP  CPAP compliance excellent Residual AHI is good Encouraged patient to continue using CPAP nightly and > 4 hours each night F/U in 1 year or sooner if needed  Butch Penny, MSN, NP-C 10/08/2022, 10:56 AM [facility] [address], [address] [place], [place] [address] [phone]

## 2022-10-18 IMAGING — MR MR LUMBAR SPINE W/O CM
4 of 5 series · 28 of 48 positions shown · non-contrast
Comparison: None.

CLINICAL DATA: Left leg pain worsening over the last 6-8 months.

EXAM:
MRI LUMBAR SPINE WITHOUT CONTRAST
TECHNIQUE: Multiplanar, multisequence MR imaging of the lumbar spine was
performed. No intravenous contrast was administered.

[Series 3: T2 · sagittal · 4.0mm · 1.09mm/px · 6 of 17 slices shown (1 of 2)]
[im 1/17]
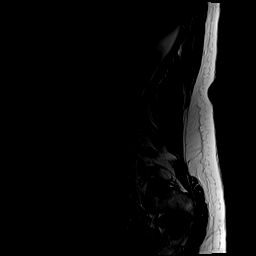
[im 4/17]
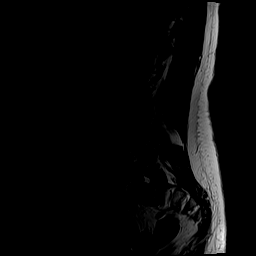
[im 7/17]
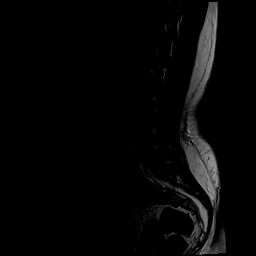
[im 10/17]
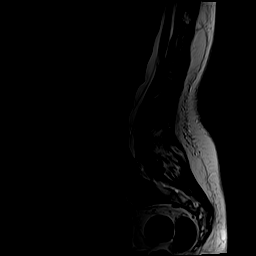
[im 13/17]
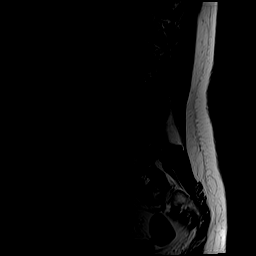
[im 17/17]
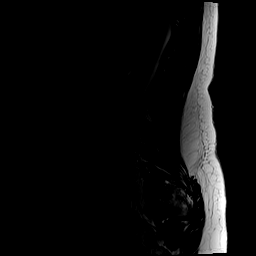

[Series 5: T1 · sagittal · 4.0mm · 1.09mm/px · 7 of 17 slices shown (1 of 2)]
[im 1/17]
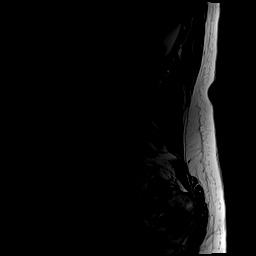
[im 3/17]
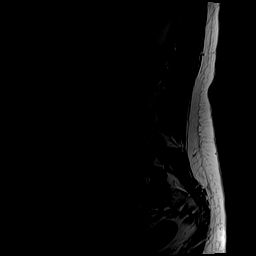
[im 6/17]
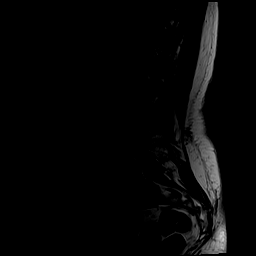
[im 9/17]
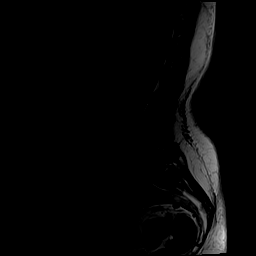
[im 11/17]
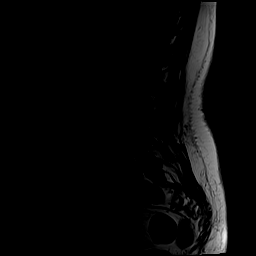
[im 14/17]
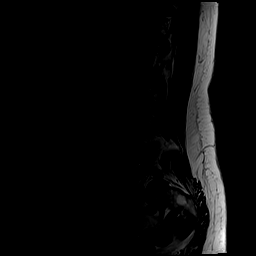
[im 17/17]
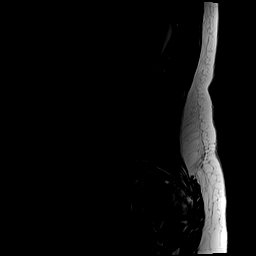

[Series 6: T2 · axial · 4.0mm · 0.39mm/px · z∈[-133,+62]mm · 8 of 37 slices shown (2 of 2)]
[im 1/37]
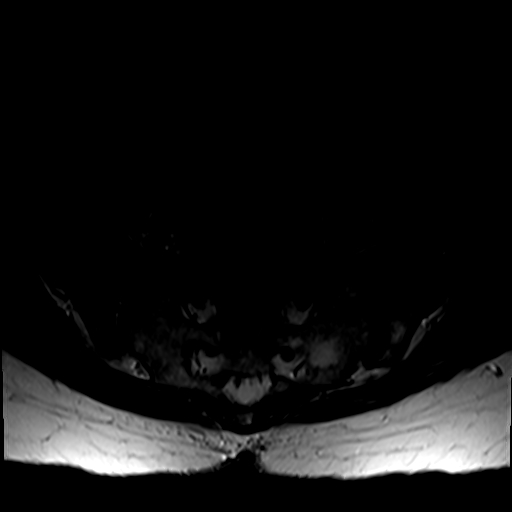
[im 6/37]
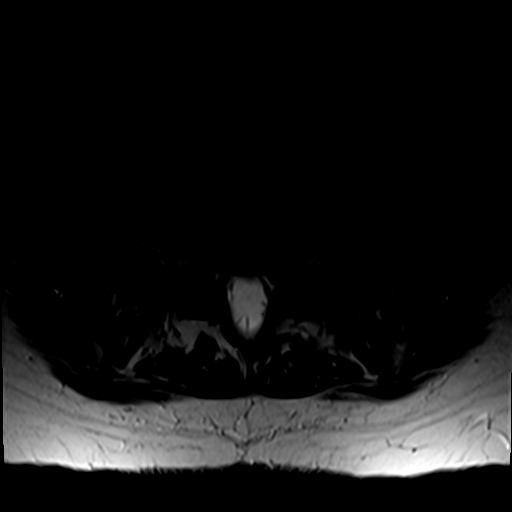
[im 12/37]
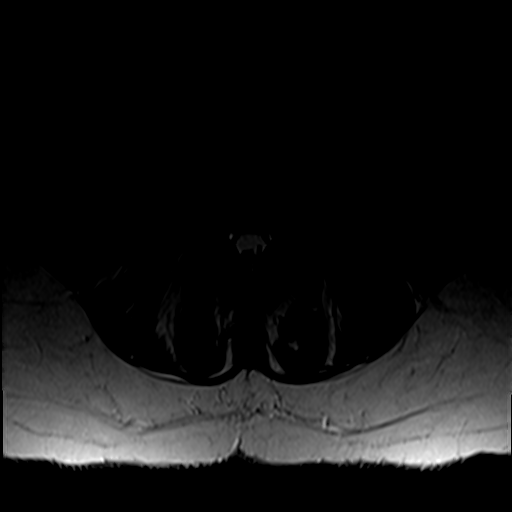
[im 17/37]
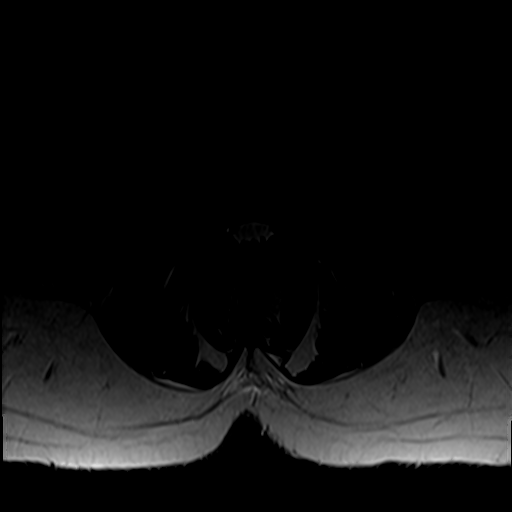
[im 20/37]
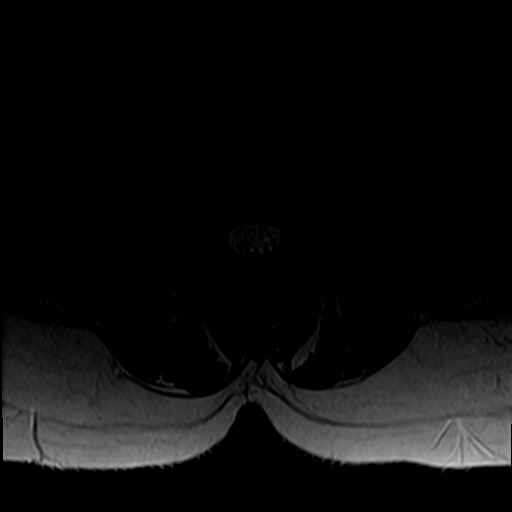
[im 25/37]
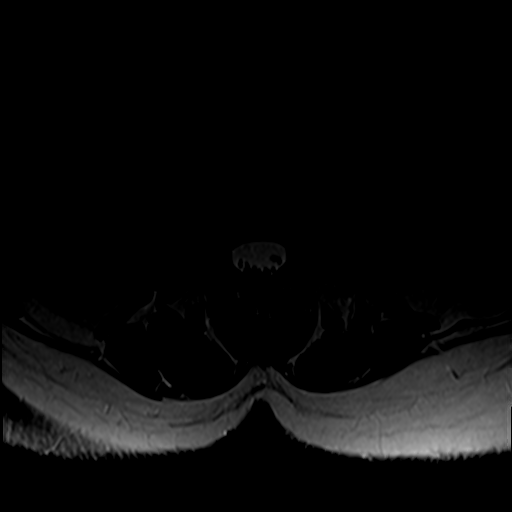
[im 31/37]
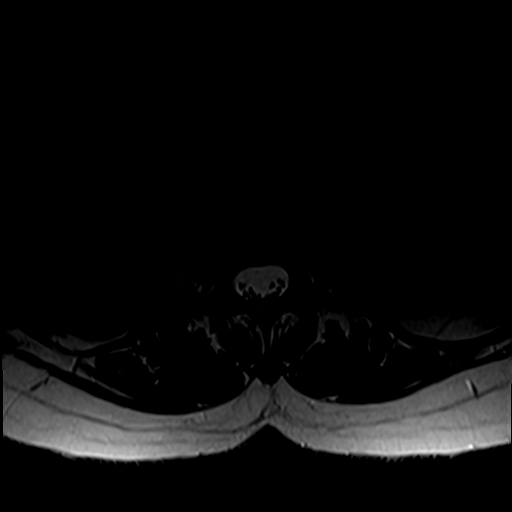
[im 37/37]
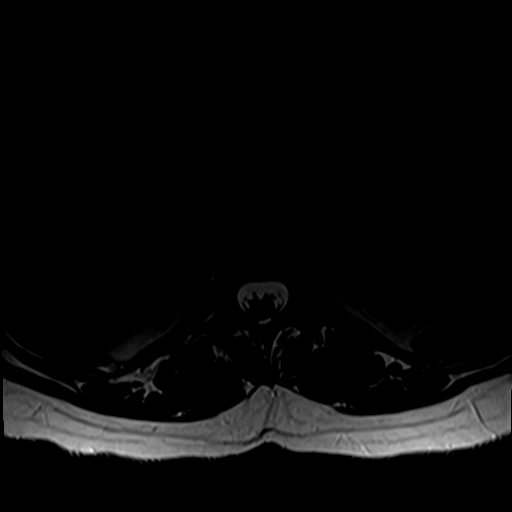

[Series 7: T1 · axial · 4.0mm · 0.39mm/px · z∈[-133,+34]mm · 7 of 37 slices shown (2 of 2)]
[im 1/37]
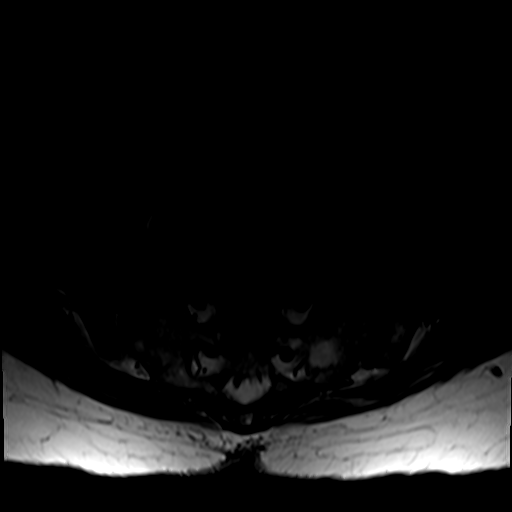
[im 6/37]
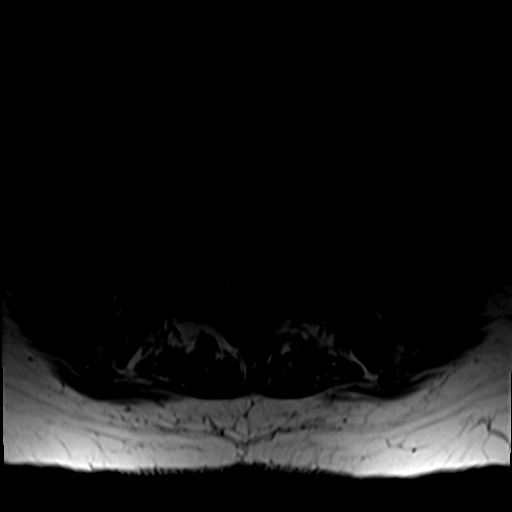
[im 12/37]
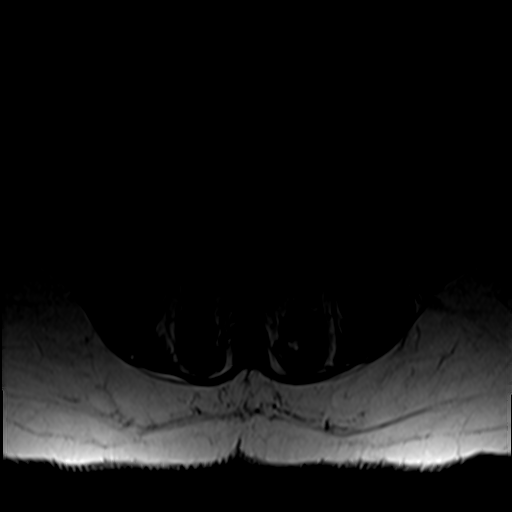
[im 17/37]
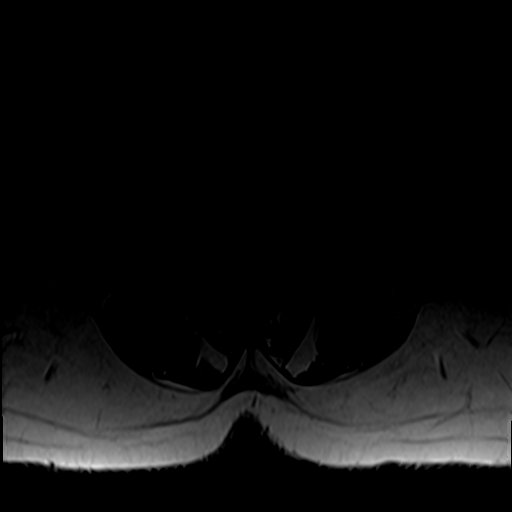
[im 20/37]
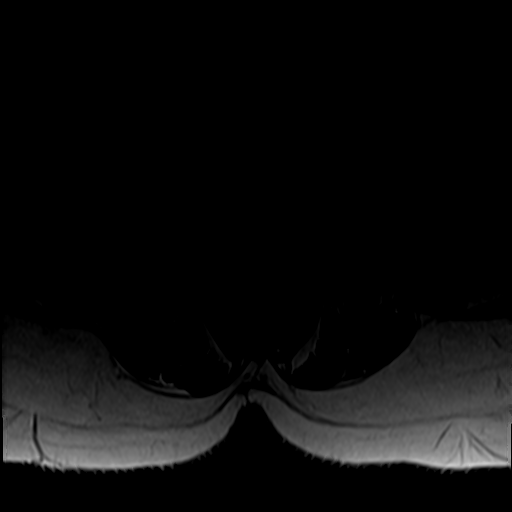
[im 25/37]
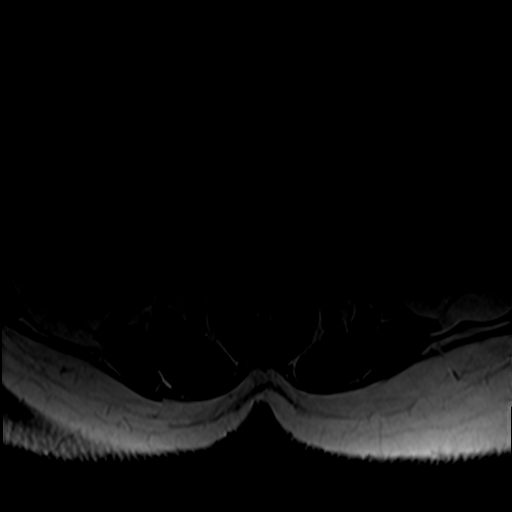
[im 31/37]
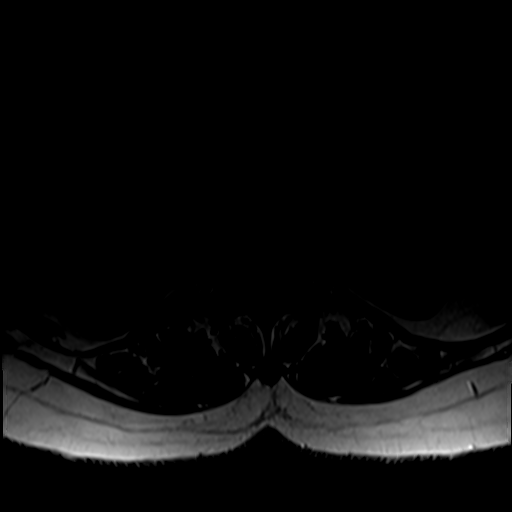

[28 of 48 positions shown; findings below may reference images not displayed]

FINDINGS: Segmentation: 5 lumbar type vertebral bodies assumed. Some
transitional features of the lowest vertebra. Correlation with this
numbering terminology would be important should intervention be
contemplated.

Alignment:  Slightly exaggerated lumbar lordosis.

Vertebrae:  Endplate Schmorl's nodes inferiorly at L1.

Conus medullaris and cauda equina: Conus extends to the T12-L1
level. Conus and cauda equina appear normal.

Paraspinal and other soft tissues: Negative except for uterine
leiomyomas.

Disc levels:

T12-L1: Normal

L1-2: Disc degeneration with endplate osteophytes and bulging of the
disc. No compressive narrowing of the canal or foramina.

L2-3: Minimal disc bulge. Mild facet hypertrophy. No compressive
stenosis.

L3-4: Minimal disc bulge. Mild facet hypertrophy. No compressive
stenosis.

L4-5: Minimal disc bulge. Facet and ligamentous hypertrophy. Mild
facet joint edema. No compressive stenosis. The facet arthropathy
could be a cause of back pain or referred facet syndrome pain.

L5-S1: Some transitional features as noted above. Minimal disc
bulge. Mild facet osteoarthritis with minimal edema. No canal or
foraminal stenosis. The facet arthropathy could be a cause of back
pain or referred facet syndrome pain.
IMPRESSION: 1. No compressive stenosis of the canal or foramina.
2. Facet osteoarthritis at L4-5 and L5-S1 with mild edema. This
could be a cause of back pain or referred facet syndrome pain.

## 2022-10-20 DIAGNOSIS — M25512 Pain in left shoulder: Secondary | ICD-10-CM | POA: Diagnosis not present

## 2022-10-20 DIAGNOSIS — M79642 Pain in left hand: Secondary | ICD-10-CM | POA: Diagnosis not present

## 2022-10-20 DIAGNOSIS — M79641 Pain in right hand: Secondary | ICD-10-CM | POA: Diagnosis not present

## 2022-11-17 ENCOUNTER — Telehealth (HOSPITAL_BASED_OUTPATIENT_CLINIC_OR_DEPARTMENT_OTHER): Payer: Self-pay | Admitting: *Deleted

## 2022-11-17 NOTE — Telephone Encounter (Signed)
Pt called with complaints of what feels like a yeast infection. She reports some vaginal itching. She had the same symptoms at the end of July and was positive for yeast. Pt is unable to come in for self swab as she is leaving to go out of town. Advised that I would discuss with provider and get back to her with recommendations. If provider will send in prescription, she requests that we call her first to verify pharmacy as she is unsure at this time where she would like it sent.

## 2022-11-22 NOTE — Telephone Encounter (Signed)
Called pt to check on symptoms. Pt is no longer having itching. Pt will call back if symptoms recur.

## 2022-11-24 ENCOUNTER — Other Ambulatory Visit (HOSPITAL_BASED_OUTPATIENT_CLINIC_OR_DEPARTMENT_OTHER): Payer: Self-pay | Admitting: Obstetrics & Gynecology

## 2023-01-03 DIAGNOSIS — I10 Essential (primary) hypertension: Secondary | ICD-10-CM | POA: Diagnosis not present

## 2023-01-03 DIAGNOSIS — D649 Anemia, unspecified: Secondary | ICD-10-CM | POA: Diagnosis not present

## 2023-01-03 DIAGNOSIS — R519 Headache, unspecified: Secondary | ICD-10-CM | POA: Diagnosis not present

## 2023-01-03 DIAGNOSIS — M170 Bilateral primary osteoarthritis of knee: Secondary | ICD-10-CM | POA: Diagnosis not present

## 2023-01-03 DIAGNOSIS — R413 Other amnesia: Secondary | ICD-10-CM | POA: Diagnosis not present

## 2023-01-03 DIAGNOSIS — R7301 Impaired fasting glucose: Secondary | ICD-10-CM | POA: Diagnosis not present

## 2023-01-03 DIAGNOSIS — Z23 Encounter for immunization: Secondary | ICD-10-CM | POA: Diagnosis not present

## 2023-01-03 DIAGNOSIS — D692 Other nonthrombocytopenic purpura: Secondary | ICD-10-CM | POA: Diagnosis not present

## 2023-02-11 DIAGNOSIS — G4733 Obstructive sleep apnea (adult) (pediatric): Secondary | ICD-10-CM | POA: Diagnosis not present

## 2023-03-07 DIAGNOSIS — H04123 Dry eye syndrome of bilateral lacrimal glands: Secondary | ICD-10-CM | POA: Diagnosis not present

## 2023-03-07 DIAGNOSIS — Z961 Presence of intraocular lens: Secondary | ICD-10-CM | POA: Diagnosis not present

## 2023-03-07 DIAGNOSIS — H18593 Other hereditary corneal dystrophies, bilateral: Secondary | ICD-10-CM | POA: Diagnosis not present

## 2023-03-07 DIAGNOSIS — H524 Presbyopia: Secondary | ICD-10-CM | POA: Diagnosis not present

## 2023-04-05 DIAGNOSIS — Z1231 Encounter for screening mammogram for malignant neoplasm of breast: Secondary | ICD-10-CM | POA: Diagnosis not present

## 2023-04-20 DIAGNOSIS — M25512 Pain in left shoulder: Secondary | ICD-10-CM | POA: Diagnosis not present

## 2023-05-03 ENCOUNTER — Other Ambulatory Visit (HOSPITAL_BASED_OUTPATIENT_CLINIC_OR_DEPARTMENT_OTHER): Payer: Self-pay | Admitting: Obstetrics & Gynecology

## 2023-05-04 ENCOUNTER — Telehealth (HOSPITAL_BASED_OUTPATIENT_CLINIC_OR_DEPARTMENT_OTHER): Payer: Self-pay

## 2023-05-04 NOTE — Telephone Encounter (Signed)
 Called and spoke with patient. Informed patient that she needs to follow up in the office to receive medication and refills. Patient will set up an appointment. Patient has been advised that once she has made an appointment, medication will be refilled up until that scheduled appointment. tbw

## 2023-05-04 NOTE — Telephone Encounter (Signed)
 Patient would like for you to give her a call. She states that she has talked to you before about pain with intercourse. She has been prescribed many things that did not work. She states that it is not the dryness because they have found things to help with that. She wants to ask you about something she saw on TV that she heard is really good. It is called Silky Peach Cream. It is Non-Harmonal but just wants to ask you about your professional opinion. tbw

## 2023-05-09 NOTE — Telephone Encounter (Signed)
 Called pt with recommendations. Advised that it is ok for her to try Peachy Silk cream, it has estradiol in it and she is on HRT, so this is fine to use. Advised that if it  helps but not enough,Dr. Hyacinth Meeker could send a prescription for vaginal estradiol cream that she used this back in 2019. Pt will try the cream and let us know if this does not work for her.

## 2023-07-19 DIAGNOSIS — M25512 Pain in left shoulder: Secondary | ICD-10-CM | POA: Diagnosis not present

## 2023-07-19 DIAGNOSIS — G8929 Other chronic pain: Secondary | ICD-10-CM | POA: Diagnosis not present

## 2023-08-10 DIAGNOSIS — G4733 Obstructive sleep apnea (adult) (pediatric): Secondary | ICD-10-CM | POA: Diagnosis not present

## 2023-09-12 ENCOUNTER — Encounter (HOSPITAL_BASED_OUTPATIENT_CLINIC_OR_DEPARTMENT_OTHER): Payer: Self-pay | Admitting: Obstetrics & Gynecology

## 2023-09-12 ENCOUNTER — Ambulatory Visit (HOSPITAL_BASED_OUTPATIENT_CLINIC_OR_DEPARTMENT_OTHER): Payer: Medicare PPO | Admitting: Obstetrics & Gynecology

## 2023-09-12 VITALS — BP 136/82 | HR 94 | Ht 60.0 in | Wt 132.2 lb

## 2023-09-12 DIAGNOSIS — Z79890 Hormone replacement therapy (postmenopausal): Secondary | ICD-10-CM

## 2023-09-12 DIAGNOSIS — Z87898 Personal history of other specified conditions: Secondary | ICD-10-CM | POA: Diagnosis not present

## 2023-09-12 DIAGNOSIS — R519 Headache, unspecified: Secondary | ICD-10-CM

## 2023-09-12 DIAGNOSIS — Z9189 Other specified personal risk factors, not elsewhere classified: Secondary | ICD-10-CM

## 2023-09-12 DIAGNOSIS — A609 Anogenital herpesviral infection, unspecified: Secondary | ICD-10-CM

## 2023-09-12 DIAGNOSIS — Z01419 Encounter for gynecological examination (general) (routine) without abnormal findings: Secondary | ICD-10-CM

## 2023-09-12 DIAGNOSIS — I10 Essential (primary) hypertension: Secondary | ICD-10-CM | POA: Insufficient documentation

## 2023-09-12 MED ORDER — ACYCLOVIR 400 MG PO TABS: 400.0000 mg | ORAL_TABLET | Freq: Two times a day (BID) | ORAL | 3 refills | Status: AC

## 2023-09-12 NOTE — Progress Notes (Unsigned)
 Breast and Pelvic Exam Patient name: Joan White MRN [id number]  Date of birth: 1955-07-29 Chief Complaint:   Gynecologic Exam and Headache (Ongoing xseveral years, worse when waking up in morning, continues through day)  History of Present Illness:   Joan White is a 68 y.o. G2P2 Caucasian female here for breast and pelvic exam.  Denies vaginal bleeding.  She is on low dosed HRT.  She desires to continue this.  Taking Estrace  cream and 0.5mg  tablets 1/2 tablet daily.    She reports she has a mild headache that is present almost every morning when she wakes.  She uses peppermint oil on her forehead.  She has taken ibuprofen  and this didn't help.  Uses BC if it is severe.  Reports she has been going on for about 2 hours.  H/o deviated septum with surgery at 20.  Has sleep apnea.  Suggested stopping her estrogen right now.    We discussed switching from valtrex  to acyclovir  due to less risks of headache.    She is seeing Dr. Shayne in about three.     No LMP recorded. Patient is postmenopausal.  Last pap 09/08/2022. Results were: negative.  Last mammogram: 03/2022. Results were: normal. Family h/o breast cancer: no Last colonoscopy: 2017. Follow up 10 years.   DEXA:   scheduled with Dr. Sheppard office next month.       09/08/2022    9:47 AM 11/27/2020   11:44 AM  Depression screen PHQ 2/9  Decreased Interest 0 0  Down, Depressed, Hopeless 0 0  PHQ - 2 Score 0 0         No data to display           Review of Systems:   Pertinent items are noted in HPI Denies any headaches, blurred vision, fatigue, shortness of breath, chest pain, abdominal pain, abnormal vaginal discharge/itching/odor/irritation, problems with periods, bowel movements, urination, or intercourse unless otherwise stated above. Pertinent History Reviewed:  Reviewed past medical,surgical, social and family history.  Reviewed problem list, medications and allergies. Physical Assessment:   Vitals:    09/12/23 1102  BP: 136/82  Pulse: 94  SpO2: 100%  Weight: 132 lb 3.2 oz (60 kg)  Height: 5' (1.524 m)  Body mass index is 25.82 kg/m.        Physical Examination:   General appearance - well appearing, and in no distress  Mental status - alert, oriented to person, place, and time  Psych:  She has a normal mood and affect  Skin - warm and dry, normal color, no suspicious lesions noted  Chest - effort normal, all lung fields clear to auscultation bilaterally  Heart - normal rate and regular rhythm  Neck:  midline trachea, no thyromegaly or nodules  Breasts - breasts appear normal, no suspicious masses, no skin or nipple changes or  axillary nodes  Abdomen - soft, nontender, nondistended, no masses or organomegaly  Pelvic - VULVA: normal appearing vulva with no masses, tenderness or lesions   VAGINA: normal appearing vagina with normal color and discharge, no lesions   CERVIX: normal appearing cervix without discharge or lesions, no CMT  Thin prep pap is not done  UTERUS: uterus is felt to be normal size, shape, consistency and nontender   ADNEXA: No adnexal masses or tenderness noted.  Rectal - normal rectal, good sphincter tone, no masses felt.   Extremities:  No swelling or varicosities noted  Chaperone present for exam  No results found for  this or any previous visit (from the past 24 hours).  Assessment & Plan:    No orders of the defined types were placed in this encounter.   Meds: No orders of the defined types were placed in this encounter.   Follow-up: No follow-ups on file.  Ronal GORMAN Pinal, MD 09/12/2023 11:16 AM

## 2023-09-13 DIAGNOSIS — R519 Headache, unspecified: Secondary | ICD-10-CM | POA: Insufficient documentation

## 2023-09-13 DIAGNOSIS — A609 Anogenital herpesviral infection, unspecified: Secondary | ICD-10-CM | POA: Insufficient documentation

## 2023-09-19 DIAGNOSIS — R051 Acute cough: Secondary | ICD-10-CM | POA: Diagnosis not present

## 2023-09-19 DIAGNOSIS — J069 Acute upper respiratory infection, unspecified: Secondary | ICD-10-CM | POA: Diagnosis not present

## 2023-09-19 DIAGNOSIS — J029 Acute pharyngitis, unspecified: Secondary | ICD-10-CM | POA: Diagnosis not present

## 2023-09-26 DIAGNOSIS — R7301 Impaired fasting glucose: Secondary | ICD-10-CM | POA: Diagnosis not present

## 2023-09-26 DIAGNOSIS — I10 Essential (primary) hypertension: Secondary | ICD-10-CM | POA: Diagnosis not present

## 2023-09-26 DIAGNOSIS — M8580 Other specified disorders of bone density and structure, unspecified site: Secondary | ICD-10-CM | POA: Diagnosis not present

## 2023-09-26 DIAGNOSIS — M8589 Other specified disorders of bone density and structure, multiple sites: Secondary | ICD-10-CM | POA: Diagnosis not present

## 2023-09-26 DIAGNOSIS — E785 Hyperlipidemia, unspecified: Secondary | ICD-10-CM | POA: Diagnosis not present

## 2023-09-26 DIAGNOSIS — Z1212 Encounter for screening for malignant neoplasm of rectum: Secondary | ICD-10-CM | POA: Diagnosis not present

## 2023-09-26 DIAGNOSIS — D649 Anemia, unspecified: Secondary | ICD-10-CM | POA: Diagnosis not present

## 2023-10-03 DIAGNOSIS — F418 Other specified anxiety disorders: Secondary | ICD-10-CM | POA: Diagnosis not present

## 2023-10-03 DIAGNOSIS — D649 Anemia, unspecified: Secondary | ICD-10-CM | POA: Diagnosis not present

## 2023-10-03 DIAGNOSIS — M25512 Pain in left shoulder: Secondary | ICD-10-CM | POA: Diagnosis not present

## 2023-10-03 DIAGNOSIS — E785 Hyperlipidemia, unspecified: Secondary | ICD-10-CM | POA: Diagnosis not present

## 2023-10-03 DIAGNOSIS — Z0000 Encounter for general adult medical examination without abnormal findings: Secondary | ICD-10-CM | POA: Diagnosis not present

## 2023-10-03 DIAGNOSIS — I10 Essential (primary) hypertension: Secondary | ICD-10-CM | POA: Diagnosis not present

## 2023-10-03 DIAGNOSIS — Z1212 Encounter for screening for malignant neoplasm of rectum: Secondary | ICD-10-CM | POA: Diagnosis not present

## 2023-10-03 DIAGNOSIS — R82998 Other abnormal findings in urine: Secondary | ICD-10-CM | POA: Diagnosis not present

## 2023-10-03 DIAGNOSIS — G4733 Obstructive sleep apnea (adult) (pediatric): Secondary | ICD-10-CM | POA: Diagnosis not present

## 2023-10-03 DIAGNOSIS — R7301 Impaired fasting glucose: Secondary | ICD-10-CM | POA: Diagnosis not present

## 2023-10-03 DIAGNOSIS — K589 Irritable bowel syndrome without diarrhea: Secondary | ICD-10-CM | POA: Diagnosis not present

## 2023-10-11 ENCOUNTER — Other Ambulatory Visit: Payer: Self-pay | Admitting: Internal Medicine

## 2023-10-11 DIAGNOSIS — E785 Hyperlipidemia, unspecified: Secondary | ICD-10-CM

## 2023-10-19 ENCOUNTER — Encounter: Payer: Self-pay | Admitting: *Deleted

## 2023-10-20 ENCOUNTER — Telehealth: Payer: Medicare PPO | Admitting: Adult Health

## 2023-10-20 ENCOUNTER — Telehealth: Admitting: Adult Health

## 2023-10-20 DIAGNOSIS — G4733 Obstructive sleep apnea (adult) (pediatric): Secondary | ICD-10-CM

## 2023-10-20 NOTE — Progress Notes (Signed)
 PATIENT: Joan White DOB: Apr 30, 1955  REASON FOR VISIT: follow up HISTORY FROM: patient  Virtual Visit via Video Note  I connected with [name] on 10/20/23 at  3:00 PM EDT by a video enabled telemedicine application located remotely at [facility] and verified that I am speaking with the correct person using two identifiers who was located at their car in [place]   I discussed the limitations of evaluation and management by telemedicine and the availability of in person appointments. The patient expressed understanding and agreed to proceed.   PATIENT: Joan White DOB: April 01, 1955  REASON FOR VISIT: follow up HISTORY FROM: patient  HISTORY OF PRESENT ILLNESS: Today 10/20/23:  Joan White is a 68 y.o. female with a history of obstructive sleep apnea on CPAP. Returns today for follow-up.  She reports that the CPAP continues to work well for her.  She still does not particularly like using it every night.  She does find it beneficial.  Denies any new issues.  She currently wears the DreamWear mask.  Download is below     10/08/22: Joan White is a 68 y.o. female with a history of OSA on CPAP. Returns today for follow-up.  Her download is below.  She reports that the CPAP is working well even though she does not particularly like using it every single night.  She denies any new issues.  Her download is below  HISTORY   REVIEW OF SYSTEMS: Out of a complete 14 system review of symptoms, the patient complains only of the following symptoms, and all other reviewed systems are negative.  ALLERGIES: Allergies  Allergen Reactions   Sulfa Antibiotics Nausea And Vomiting    HOME MEDICATIONS: Outpatient Medications Prior to Visit  Medication Sig Dispense Refill   acyclovir  (ZOVIRAX ) 400 MG tablet Take 1 tablet (400 mg total) by mouth 2 (two) times daily. 180 tablet 3   buPROPion (WELLBUTRIN XL) 150 MG 24 hr tablet Take 150 mg by mouth every  morning.     Cholecalciferol (VITAMIN D3) 50 MCG (2000 UT) capsule Take 2,000 Units by mouth daily.     cyclobenzaprine  (FLEXERIL ) 10 MG tablet Take 10 mg by mouth 3 (three) times daily as needed. (Patient not taking: Reported on 09/12/2023)     DULoxetine  (CYMBALTA ) 60 MG capsule Take 1 capsule (60 mg total) by mouth daily. 90 capsule 4   estradiol  (ESTRACE ) 0.5 MG tablet TAKE 1 TABLET EVERY DAY 90 tablet 1   Magnesium Gluconate 27.5 MG TABS Take 500 mg by mouth.     Magnesium Hydroxide (MAGNESIA PO) Take 750 mg by mouth daily.     medroxyPROGESTERone  (PROVERA ) 2.5 MG tablet TAKE 1 TABLET DAILY ON DAYS 1 THROUGH 20 OF EACH MONTH 60 tablet 3   methocarbamol (ROBAXIN) 500 MG tablet Take 500 mg by mouth every 6 (six) hours as needed. (Patient not taking: Reported on 09/12/2023)     Multiple Vitamins-Minerals (MULTIVITAMIN PO) Take by mouth daily.     Probiotic Product (PHILLIPS COLON HEALTH PO) Take 1 tablet by mouth daily.     telmisartan (MICARDIS) 80 MG tablet Take 80 mg by mouth daily.     traMADol  (ULTRAM ) 50 MG tablet Take 50 mg by mouth every 4 (four) hours as needed.     XIIDRA 5 % SOLN 2 (two) times daily.     No facility-administered medications prior to visit.    PAST MEDICAL HISTORY: Past Medical History:  Diagnosis Date  Abnormal Pap smear    h/o cryo in her 74's   Arthritis    oa   Constipation    Depression    situational no meds taken   Dry eyes    Genital herpes    History of COVID-19 2021   fatigie x 5 to 6 days all symptoms resolved   History of migraine headaches    HSV infection    left leg aches @ times    Sleep apnea    SUI (stress urinary incontinence, female)    pads prn   Wears glasses     PAST SURGICAL HISTORY: Past Surgical History:  Procedure Laterality Date   BLADDER SUSPENSION N/A 03/09/2021   Procedure: TRANSVAGINAL TAPE (TVT) PROCEDURE;  Surgeon: Marilynne Rosaline SAILOR, MD;  Location: [facility];  Service: Gynecology;  Laterality:  N/A;   CATARACT EXTRACTION, BILATERAL  2019   CESAREAN SECTION     1982 and 1985   colonscopy     last done 2017   CYSTOSCOPY N/A 03/09/2021   Procedure: CYSTOSCOPY;  Surgeon: Marilynne Rosaline SAILOR, MD;  Location: [facility];  Service: Gynecology;  Laterality: N/A;   GYNECOLOGIC CRYOSURGERY     yrs ago   NASAL SEPTUM SURGERY     age 47   SHOULDER SURGERY Right 06/2016   Dr. Rubie arthroscopic for bone spurs   TUBAL LIGATION  1989    FAMILY HISTORY: Family History  Problem Relation Age of Onset   Heart disease Mother    Hypertension Mother    Ulcerative colitis Father    Colon cancer Paternal Grandmother     SOCIAL HISTORY: Social History   Socioeconomic History   Marital status: Married    Spouse name: Not on file   Number of children: Not on file   Years of education: Not on file   Highest education level: Not on file  Occupational History   Not on file  Tobacco Use   Smoking status: Never   Smokeless tobacco: Never  Vaping Use   Vaping status: Never Used  Substance and Sexual Activity   Alcohol  use: Yes    Alcohol /week: 2.0 standard drinks of alcohol     Types: 2 Glasses of wine per week    Comment: gin and tonic 1 to 2 per night   Drug use: No   Sexual activity: Yes    Partners: Male    Birth control/protection: Surgical, Post-menopausal  Other Topics Concern   Not on file  Social History Narrative   Not on file   Social Drivers of Health   Financial Resource Strain: Not on file  Food Insecurity: Not on file  Transportation Needs: Not on file  Physical Activity: Not on file  Stress: Not on file  Social Connections: Unknown (06/22/2021)   Received from [facility]   Social Network    Social Network: Not on file  Intimate Partner Violence: Unknown (05/14/2021)   Received from [facility]   HITS    Physically Hurt: Not on file    Insult or Talk Down To: Not on file    Threaten Physical Harm: Not on file    Scream or Curse: Not on  file      PHYSICAL EXAM Generalized: Well developed, in no acute distress   Neurological examination  Mentation: Alert oriented to time, place, history taking. Follows all commands speech and language fluent Cranial nerve II-XII: Facial symmetry noted  DIAGNOSTIC DATA (LABS, IMAGING, TESTING) - I reviewed  patient records, labs, notes, testing and imaging myself where available.  Lab Results  Component Value Date   HGB 11.8 (L) 11/16/2013      Component Value Date/Time   NA 135 11/16/2013 1520   K 3.8 11/16/2013 1520   CL 97 11/16/2013 1520   CO2 28 11/16/2013 1520   GLUCOSE 86 11/16/2013 1520   BUN 14 11/16/2013 1520   CREATININE 0.70 11/16/2013 1520   CALCIUM 9.8 11/16/2013 1520   PROT 7.4 11/16/2013 1520   ALBUMIN 4.5 11/16/2013 1520   AST 21 11/16/2013 1520   ALT 16 11/16/2013 1520   ALKPHOS 75 11/16/2013 1520   BILITOT 0.3 11/16/2013 1520   Lab Results  Component Value Date   CHOL 132 11/16/2013   HDL 69 11/16/2013   LDLCALC 41 11/16/2013   TRIG 109 11/16/2013   CHOLHDL 1.9 11/16/2013   No results found for: HGBA1C No results found for: VITAMINB12 Lab Results  Component Value Date   TSH 1.379 11/16/2013      ASSESSMENT AND PLAN 67 y.o. year old female  has a past medical history of Abnormal Pap smear, Arthritis, Constipation, Depression, Dry eyes, Genital herpes, History of COVID-19 (2021), History of migraine headaches, HSV infection, left leg aches @ times, Sleep apnea, SUI (stress urinary incontinence, female), and Wears glasses. here with:  OSA on CPAP  CPAP compliance excellent Residual AHI is good Encouraged patient to continue using CPAP nightly and > 4 hours each night F/U in 1 year or sooner if needed  Duwaine Russell, MSN, NP-C 10/20/2023, 3:09 PM [facility] [address], [address] [place], [place] [address] [phone]

## 2023-10-20 NOTE — Patient Instructions (Signed)
 Continue using CPAP nightly and greater than 4 hours each night If your symptoms worsen or you develop new symptoms please let us  know.

## 2023-11-11 ENCOUNTER — Encounter (HOSPITAL_BASED_OUTPATIENT_CLINIC_OR_DEPARTMENT_OTHER): Payer: Self-pay

## 2023-11-15 DIAGNOSIS — M25512 Pain in left shoulder: Secondary | ICD-10-CM | POA: Diagnosis not present

## 2023-11-15 DIAGNOSIS — G8929 Other chronic pain: Secondary | ICD-10-CM | POA: Diagnosis not present

## 2023-11-15 DIAGNOSIS — M25522 Pain in left elbow: Secondary | ICD-10-CM | POA: Diagnosis not present

## 2024-01-11 DIAGNOSIS — R197 Diarrhea, unspecified: Secondary | ICD-10-CM | POA: Diagnosis not present

## 2024-01-12 ENCOUNTER — Encounter: Payer: Self-pay | Admitting: Internal Medicine

## 2024-01-12 ENCOUNTER — Other Ambulatory Visit: Payer: Self-pay | Admitting: Internal Medicine

## 2024-01-12 DIAGNOSIS — R197 Diarrhea, unspecified: Secondary | ICD-10-CM

## 2024-01-17 ENCOUNTER — Inpatient Hospital Stay: Admission: RE | Admit: 2024-01-17 | Discharge: 2024-01-17 | Attending: Internal Medicine | Admitting: Internal Medicine

## 2024-01-17 DIAGNOSIS — R197 Diarrhea, unspecified: Secondary | ICD-10-CM | POA: Diagnosis not present

## 2024-01-17 MED ORDER — IOPAMIDOL (ISOVUE-300) INJECTION 61%
100.0000 mL | Freq: Once | INTRAVENOUS | Status: AC | PRN
  Administered 2024-01-17: 100 mL via INTRAVENOUS

## 2024-07-11 ENCOUNTER — Ambulatory Visit: Admitting: Adult Health

## 2024-09-14 ENCOUNTER — Ambulatory Visit (HOSPITAL_BASED_OUTPATIENT_CLINIC_OR_DEPARTMENT_OTHER): Admitting: Obstetrics & Gynecology

## 2024-10-18 ENCOUNTER — Telehealth: Admitting: Adult Health
